# Patient Record
Sex: Female | Born: 1965
Health system: Southern US, Community
[De-identification: ages and names within clinical notes are randomized; demographics above are authoritative.]

## PROBLEM LIST (undated history)

## (undated) DIAGNOSIS — Z973 Presence of spectacles and contact lenses: Secondary | ICD-10-CM

## (undated) DIAGNOSIS — R51 Headache: Secondary | ICD-10-CM

## (undated) DIAGNOSIS — R87629 Unspecified abnormal cytological findings in specimens from vagina: Secondary | ICD-10-CM

## (undated) DIAGNOSIS — I959 Hypotension, unspecified: Secondary | ICD-10-CM

## (undated) DIAGNOSIS — T7840XA Allergy, unspecified, initial encounter: Secondary | ICD-10-CM

## (undated) DIAGNOSIS — R519 Headache, unspecified: Secondary | ICD-10-CM

## (undated) DIAGNOSIS — R569 Unspecified convulsions: Secondary | ICD-10-CM

## (undated) DIAGNOSIS — M8580 Other specified disorders of bone density and structure, unspecified site: Secondary | ICD-10-CM

## (undated) HISTORY — DX: Unspecified abnormal cytological findings in specimens from vagina: R87.629

## (undated) HISTORY — DX: Allergy, unspecified, initial encounter: T78.40XA

## (undated) HISTORY — PX: BREAST SURGERY: SHX581

## (undated) HISTORY — DX: Headache, unspecified: R51.9

## (undated) HISTORY — PX: CHOLECYSTECTOMY: SHX55

## (undated) HISTORY — DX: Headache: R51

## (undated) HISTORY — DX: Other specified disorders of bone density and structure, unspecified site: M85.80

## (undated) HISTORY — PX: ABDOMINAL HYSTERECTOMY: SHX81

## (undated) HISTORY — DX: Unspecified convulsions: R56.9

---

## 1987-02-16 HISTORY — PX: KNEE ARTHROSCOPY: SHX127

## 1992-10-18 DIAGNOSIS — R569 Unspecified convulsions: Secondary | ICD-10-CM

## 1992-10-18 HISTORY — DX: Unspecified convulsions: R56.9

## 2005-08-10 ENCOUNTER — Encounter (HOSPITAL_COMMUNITY): Admission: RE | Admit: 2005-08-10 | Discharge: 2005-09-09 | Payer: Self-pay | Admitting: Family Medicine

## 2005-08-18 HISTORY — PX: GALLBLADDER SURGERY: SHX652

## 2005-08-20 ENCOUNTER — Ambulatory Visit (HOSPITAL_COMMUNITY): Admission: RE | Admit: 2005-08-20 | Discharge: 2005-08-20 | Payer: Self-pay | Admitting: General Surgery

## 2005-09-06 ENCOUNTER — Encounter (INDEPENDENT_AMBULATORY_CARE_PROVIDER_SITE_OTHER): Payer: Self-pay | Admitting: General Surgery

## 2005-09-06 ENCOUNTER — Observation Stay (HOSPITAL_COMMUNITY): Admission: RE | Admit: 2005-09-06 | Discharge: 2005-09-07 | Payer: Self-pay | Admitting: General Surgery

## 2006-05-28 ENCOUNTER — Emergency Department (HOSPITAL_COMMUNITY): Admission: EM | Admit: 2006-05-28 | Discharge: 2006-05-28 | Payer: Self-pay | Admitting: Family Medicine

## 2007-04-19 ENCOUNTER — Other Ambulatory Visit: Admission: RE | Admit: 2007-04-19 | Discharge: 2007-04-19 | Payer: Self-pay | Admitting: Gynecology

## 2007-05-29 ENCOUNTER — Encounter: Admission: RE | Admit: 2007-05-29 | Discharge: 2007-05-29 | Payer: Self-pay | Admitting: Gynecology

## 2007-11-29 ENCOUNTER — Other Ambulatory Visit: Admission: RE | Admit: 2007-11-29 | Discharge: 2007-11-29 | Payer: Self-pay | Admitting: Gynecology

## 2008-01-21 ENCOUNTER — Emergency Department (HOSPITAL_COMMUNITY): Admission: EM | Admit: 2008-01-21 | Discharge: 2008-01-21 | Payer: Self-pay | Admitting: Emergency Medicine

## 2008-04-24 ENCOUNTER — Other Ambulatory Visit: Admission: RE | Admit: 2008-04-24 | Discharge: 2008-04-24 | Payer: Self-pay | Admitting: Gynecology

## 2008-11-20 ENCOUNTER — Encounter: Admission: RE | Admit: 2008-11-20 | Discharge: 2008-11-20 | Payer: Self-pay | Admitting: Gynecology

## 2008-12-20 ENCOUNTER — Ambulatory Visit: Payer: Self-pay | Admitting: Gynecology

## 2009-05-30 ENCOUNTER — Other Ambulatory Visit: Admission: RE | Admit: 2009-05-30 | Discharge: 2009-05-30 | Payer: Self-pay | Admitting: Gynecology

## 2009-05-30 ENCOUNTER — Encounter: Payer: Self-pay | Admitting: Gynecology

## 2009-05-30 ENCOUNTER — Ambulatory Visit: Payer: Self-pay | Admitting: Gynecology

## 2010-09-11 ENCOUNTER — Encounter: Admission: RE | Admit: 2010-09-11 | Discharge: 2010-09-11 | Payer: Self-pay | Admitting: Gynecology

## 2010-11-08 ENCOUNTER — Encounter: Payer: Self-pay | Admitting: Gynecology

## 2010-12-04 ENCOUNTER — Encounter: Payer: Self-pay | Admitting: Gynecology

## 2010-12-14 ENCOUNTER — Encounter (INDEPENDENT_AMBULATORY_CARE_PROVIDER_SITE_OTHER): Payer: BC Managed Care – PPO | Admitting: Gynecology

## 2010-12-14 ENCOUNTER — Other Ambulatory Visit (HOSPITAL_COMMUNITY)
Admission: RE | Admit: 2010-12-14 | Discharge: 2010-12-14 | Disposition: A | Discharge status: Home / Self Care | Payer: BC Managed Care – PPO | Source: Ambulatory Visit | Attending: Gynecology | Admitting: Gynecology

## 2010-12-14 ENCOUNTER — Other Ambulatory Visit: Payer: Self-pay | Admitting: Gynecology

## 2010-12-14 DIAGNOSIS — Z124 Encounter for screening for malignant neoplasm of cervix: Secondary | ICD-10-CM | POA: Insufficient documentation

## 2010-12-14 DIAGNOSIS — R823 Hemoglobinuria: Secondary | ICD-10-CM

## 2010-12-14 DIAGNOSIS — Z13220 Encounter for screening for lipoid disorders: Secondary | ICD-10-CM

## 2010-12-14 DIAGNOSIS — Z833 Family history of diabetes mellitus: Secondary | ICD-10-CM

## 2010-12-14 DIAGNOSIS — Z01419 Encounter for gynecological examination (general) (routine) without abnormal findings: Secondary | ICD-10-CM

## 2010-12-17 ENCOUNTER — Other Ambulatory Visit (INDEPENDENT_AMBULATORY_CARE_PROVIDER_SITE_OTHER): Payer: BC Managed Care – PPO

## 2010-12-17 DIAGNOSIS — N390 Urinary tract infection, site not specified: Secondary | ICD-10-CM

## 2011-01-13 ENCOUNTER — Other Ambulatory Visit: Payer: BC Managed Care – PPO

## 2011-03-05 NOTE — Op Note (Signed)
Jamie Hale, RALPHS NO.:  [id number]   MEDICAL RECORD NO.:  [id number]          PATIENT TYPE:  OBV   LOCATION:  A301                          FACILITY:  [facility]   PHYSICIAN:  Barbaraann Barthel, M.D. DATE OF BIRTH:  16-Sep-1966   DATE OF PROCEDURE:  09/06/2005  DATE OF DISCHARGE:                                 OPERATIVE REPORT   SURGEON:  Dr. Malvin Johns.   POSTOPERATIVE DIAGNOSIS:  Cholecystitis secondary to biliary dyskinesia   PROCEDURE:  Laparoscopic cholecystectomy.   SPECIMEN:  Gallbladder.   NOTE:  This is a 45 year old white female who had at least four severe  episodes of right upper quadrant postprandial pain after fatty meals. This  was not relieved with Gas-X or Tums or other medications. She had a CT scan  that did not chest stones and ejection fraction on hepatobiliary scan was  39%. She continued to have right upper quadrant pain with medical treatment  and bloating. Sonogram did not show the presence of stones. The liver  function studies were all within normal limits without elevated amylase. We  discussed the diet manipulation which she had tried unsuccessfully, and she  requested surgery. We discussed the procedure in detail with her, discussing  complications not limited to but including bleeding, infection, damage to  bile duct, perforation of organs and transitory diarrhea. We also discussed  that results of cholecystectomy for biliary dyskinesia were not as  satisfying as when there is the presence of cholelithiasis. We also  discussed the possibility that open surgery may be required. Informed  consent was obtained.   GROSS OPERATIVE FINDINGS:  The patient had a normal right upper quadrant. No  normality is noted. Small cystic duct which was not cannulated. No  abnormalities noted otherwise other than bloating of her supine position,  after the adequate administration of general anesthesia via endotracheal  intubation, her entire was  prepped with Betadine solution and draped in  usual manner. Prior to this, a Foley catheter was aseptically inserted with  the patient in Trendelenburg. A periumbilical incision was carried out over  the superior aspect of the umbilicus. The fascia was grasped with a sharp  towel clip and elevated, the Veress needle was inserted and confirmed  persistent with position with a saline drop test. After being inserted, we  placed an 11-mm cannula in the epigastrium and two 5-mm cannulas in the  right upper quadrant laterally under direct vision. The gallbladder was  grasped. Adhesions were taken down, lysed, and photographs were taken of  this. These were triply silver clipped and divided. The gallbladder was  uneventfully removed from the hepatic bed. I elected to leave a piece of  Surgicel in this area, and after irrigating andand checking for hemostasis,  the gallbladder was removed with the Endosac device. We then used 0 Polysorb  to  close the incisions in the epigastrium and the umbilicus area using 1/2%  Sensorcaine on all port incisions for postoperative comfort. Prior to  closure, all sponge, needle and instrument counts were found to be correct.  Estimated blood loss was minimal.  The patient received 1,200 cc of  crystalloid. There were no complications.      Barbaraann Barthel, M.D.  Electronically Signed     WB/MEDQ  D:  09/06/2005  T:  09/06/2005  Job:  [id number]   cc:   Corrie Mckusick, M.D.  Fax: [phone]

## 2011-09-03 ENCOUNTER — Encounter: Payer: Self-pay | Admitting: Gynecology

## 2011-09-03 ENCOUNTER — Ambulatory Visit (INDEPENDENT_AMBULATORY_CARE_PROVIDER_SITE_OTHER): Payer: BC Managed Care – PPO | Admitting: Gynecology

## 2011-09-03 VITALS — BP 120/70

## 2011-09-03 DIAGNOSIS — B373 Candidiasis of vulva and vagina: Secondary | ICD-10-CM

## 2011-09-03 DIAGNOSIS — R300 Dysuria: Secondary | ICD-10-CM

## 2011-09-03 DIAGNOSIS — N899 Noninflammatory disorder of vagina, unspecified: Secondary | ICD-10-CM

## 2011-09-03 DIAGNOSIS — B3731 Acute candidiasis of vulva and vagina: Secondary | ICD-10-CM

## 2011-09-03 DIAGNOSIS — N390 Urinary tract infection, site not specified: Secondary | ICD-10-CM

## 2011-09-03 DIAGNOSIS — N898 Other specified noninflammatory disorders of vagina: Secondary | ICD-10-CM

## 2011-09-03 DIAGNOSIS — R82998 Other abnormal findings in urine: Secondary | ICD-10-CM

## 2011-09-03 DIAGNOSIS — R569 Unspecified convulsions: Secondary | ICD-10-CM | POA: Insufficient documentation

## 2011-09-03 MED ORDER — FLUCONAZOLE 150 MG PO TABS: 150.0000 mg | ORAL_TABLET | Freq: Once | ORAL | Status: AC

## 2011-09-03 MED ORDER — SULFAMETHOXAZOLE-TRIMETHOPRIM 800-160 MG PO TABS: 1.0000 | ORAL_TABLET | Freq: Two times a day (BID) | ORAL | Status: AC

## 2011-09-03 NOTE — Progress Notes (Signed)
Addended by: Landis Martins R on: 09/03/2011 05:13 PM   Modules accepted: Orders

## 2011-09-03 NOTE — Progress Notes (Signed)
Patient presents with several days of lower abdominal bloating, vaginal burning and some urinary hesitancy. No fevers chills low back pain.  Exam Abdomen soft nontender without masses guarding rebound organomegaly Pelvic external BUS vagina with white discharge. Cervix normal. Uterus normal size midline mobile nontender. Adnexa without masses or tenderness  Assessment and plan: 1. Yeast vaginitis. Wet prep is positive for yeast. We'll treat with Diflucan 150x1 dose. 2. UTI. Urinalysis low grade positive with 2-4 WBC, occasional epithelial cells, 1+ bacteria, positive leukocyte esterase. Will cover with Septra DS one by mouth twice a day x3 days follow up if symptoms persist or recur

## 2011-12-27 ENCOUNTER — Other Ambulatory Visit: Payer: Self-pay | Admitting: Gynecology

## 2011-12-27 DIAGNOSIS — Z1231 Encounter for screening mammogram for malignant neoplasm of breast: Secondary | ICD-10-CM

## 2011-12-29 ENCOUNTER — Ambulatory Visit
Admission: RE | Admit: 2011-12-29 | Discharge: 2011-12-29 | Disposition: A | Discharge status: Home / Self Care | Payer: BC Managed Care – PPO | Source: Ambulatory Visit | Attending: Gynecology | Admitting: Gynecology

## 2011-12-29 DIAGNOSIS — Z1231 Encounter for screening mammogram for malignant neoplasm of breast: Secondary | ICD-10-CM

## 2011-12-30 ENCOUNTER — Other Ambulatory Visit: Payer: Self-pay | Admitting: Gynecology

## 2011-12-31 ENCOUNTER — Other Ambulatory Visit: Payer: Self-pay | Admitting: *Deleted

## 2011-12-31 DIAGNOSIS — N63 Unspecified lump in unspecified breast: Secondary | ICD-10-CM

## 2012-01-04 ENCOUNTER — Ambulatory Visit
Admission: RE | Admit: 2012-01-04 | Discharge: 2012-01-04 | Disposition: A | Discharge status: Home / Self Care | Payer: BC Managed Care – PPO | Source: Ambulatory Visit | Attending: Gynecology | Admitting: Gynecology

## 2012-01-04 DIAGNOSIS — N63 Unspecified lump in unspecified breast: Secondary | ICD-10-CM

## 2012-01-11 ENCOUNTER — Ambulatory Visit (INDEPENDENT_AMBULATORY_CARE_PROVIDER_SITE_OTHER): Payer: BC Managed Care – PPO | Admitting: Gynecology

## 2012-01-11 ENCOUNTER — Encounter: Payer: Self-pay | Admitting: Gynecology

## 2012-01-11 VITALS — BP 102/60 | Ht 68.0 in | Wt 135.0 lb

## 2012-01-11 DIAGNOSIS — Z01419 Encounter for gynecological examination (general) (routine) without abnormal findings: Secondary | ICD-10-CM

## 2012-01-11 DIAGNOSIS — Z13220 Encounter for screening for lipoid disorders: Secondary | ICD-10-CM

## 2012-01-11 DIAGNOSIS — Z131 Encounter for screening for diabetes mellitus: Secondary | ICD-10-CM

## 2012-01-11 NOTE — Progress Notes (Signed)
Jamie Hale May 27, 1966 [id number]        46 y.o.  for annual exam.  Doing well no complaints.  Past medical history,surgical history, medications, allergies, family history and social history were all reviewed and documented in the EPIC chart. ROS:  Was performed and pertinent positives and negatives are included in the history.  Exam: [name] chaperone present Filed Vitals:   01/11/12 1138  BP: 102/60   General appearance  Normal Skin grossly normal Head/Neck normal with no cervical or supraclavicular adenopathy thyroid normal Lungs  clear Cardiac RR, without RMG Abdominal  soft, nontender, without masses, organomegaly or hernia Breasts  examined lying and sitting without masses, retractions, discharge or axillary adenopathy. Pelvic  Ext/BUS/vagina  normal   Cervix  normal    Uterus  anteverted, normal size, shape and contour, midline and mobile nontender   Adnexa  Without masses or tenderness    Anus and perineum  normal   Rectovaginal  normal sphincter tone without palpated masses or tenderness.    Assessment/Plan:  46 y.o. female for annual exam.    1. Regular menses doing well. 2. Contraception. Patient using condoms. We again discussed the failure risk and the availability of Plan B. Alternatives reviewed and she's not interested and wants to continue using condoms. 3. Mammography. Patient just had her mammogram which was normal. Will continue with annual mammography. SBE monthly reviewed. 4. Pap smear. No Pap smear was done today. Last Pap smear 2012. She has no history of abnormalities with multiple normal records in her chart. I reviewed current screening guidelines we'll plan on every three-year Pap smears. 5. Health maintenance. Patient ate breakfast this morning. She'll return for fasting CBC lipid profile and glucose. Urinalysis today was done. Assuming she continues well from a gynecologic standpoint and she will see [place] in a year, sooner as  needed.     [name] MD, 11:56 AM 01/11/2012

## 2012-01-11 NOTE — Patient Instructions (Signed)
Follow up for fasting blood work as discussed. Follow up in one year for annual gynecologic exam.

## 2012-01-12 LAB — URINALYSIS W MICROSCOPIC + REFLEX CULTURE
Bacteria, UA: NONE SEEN
Bilirubin Urine: NEGATIVE
Casts: NONE SEEN
Crystals: NONE SEEN
Glucose, UA: NEGATIVE mg/dL
Hgb urine dipstick: NEGATIVE
Ketones, ur: NEGATIVE mg/dL
Nitrite: NEGATIVE
Protein, ur: NEGATIVE mg/dL
Specific Gravity, Urine: <1.005 — ABNORMAL LOW (ref 1.005–1.030)
Squamous Epithelial / HPF: NONE SEEN
Urobilinogen, UA: 0.2 mg/dL (ref 0.0–1.0)
pH: 6 (ref 5.0–8.0)

## 2012-01-13 LAB — URINE CULTURE: Colony Count: 1000

## 2012-12-26 ENCOUNTER — Other Ambulatory Visit: Payer: Self-pay

## 2012-12-26 DIAGNOSIS — Z1231 Encounter for screening mammogram for malignant neoplasm of breast: Secondary | ICD-10-CM

## 2013-01-18 ENCOUNTER — Ambulatory Visit
Admission: RE | Admit: 2013-01-18 | Discharge: 2013-01-18 | Disposition: A | Discharge status: Home / Self Care | Payer: BC Managed Care – PPO | Source: Ambulatory Visit

## 2013-01-18 DIAGNOSIS — Z1231 Encounter for screening mammogram for malignant neoplasm of breast: Secondary | ICD-10-CM

## 2013-01-22 ENCOUNTER — Other Ambulatory Visit: Payer: Self-pay | Admitting: Gynecology

## 2013-01-23 ENCOUNTER — Other Ambulatory Visit: Payer: Self-pay | Admitting: *Deleted

## 2013-01-23 DIAGNOSIS — N6320 Unspecified lump in the left breast, unspecified quadrant: Secondary | ICD-10-CM

## 2013-01-23 DIAGNOSIS — R928 Other abnormal and inconclusive findings on diagnostic imaging of breast: Secondary | ICD-10-CM

## 2013-02-01 ENCOUNTER — Ambulatory Visit
Admission: RE | Admit: 2013-02-01 | Discharge: 2013-02-01 | Disposition: A | Discharge status: Home / Self Care | Payer: BC Managed Care – PPO | Source: Ambulatory Visit | Attending: Gynecology | Admitting: Gynecology

## 2013-02-01 DIAGNOSIS — R928 Other abnormal and inconclusive findings on diagnostic imaging of breast: Secondary | ICD-10-CM

## 2013-02-01 DIAGNOSIS — N6320 Unspecified lump in the left breast, unspecified quadrant: Secondary | ICD-10-CM

## 2013-03-08 ENCOUNTER — Encounter: Payer: Self-pay | Admitting: Gynecology

## 2013-03-08 ENCOUNTER — Ambulatory Visit (INDEPENDENT_AMBULATORY_CARE_PROVIDER_SITE_OTHER): Payer: BC Managed Care – PPO | Admitting: Gynecology

## 2013-03-08 VITALS — BP 116/70

## 2013-03-08 DIAGNOSIS — N390 Urinary tract infection, site not specified: Secondary | ICD-10-CM

## 2013-03-08 DIAGNOSIS — R300 Dysuria: Secondary | ICD-10-CM

## 2013-03-08 LAB — URINALYSIS W MICROSCOPIC + REFLEX CULTURE
Bilirubin Urine: NEGATIVE
Casts: NONE SEEN
Crystals: NONE SEEN
Glucose, UA: NEGATIVE mg/dL
Ketones, ur: NEGATIVE mg/dL
Nitrite: NEGATIVE
Protein, ur: 30 mg/dL — AB
Specific Gravity, Urine: 1.025 (ref 1.005–1.030)
Urobilinogen, UA: 0.2 mg/dL (ref 0.0–1.0)
pH: 5 (ref 5.0–8.0)

## 2013-03-08 MED ORDER — CIPROFLOXACIN HCL 250 MG PO TABS: 250.0000 mg | ORAL_TABLET | Freq: Two times a day (BID) | ORAL | Status: DC

## 2013-03-08 MED ORDER — FLUCONAZOLE 100 MG PO TABS: ORAL_TABLET | ORAL | Status: DC

## 2013-03-08 NOTE — Progress Notes (Signed)
Patient presented to the office today stating that yesterday she began complaining of urinary frequency but did not appear to be emptying completely. She was also experiencing dysuria but no fever chills nausea or vomiting or any back pain. Patient denied any vaginal discharge. She's currently using condoms for contraception and her cycles of been regular.  Exam: Back: No CVA tenderness Abdomen: Soft slightly tender suprapubic area Pelvic: Bartholin urethra Skene was within normal limits Vagina: No lesions or discharge Cervix: No lesions or discharge Uterus: Anteverted tender over the bladder area Adnexa: No palpable masses or tenderness Rectal exam: Not done  Urinalysis: WBC 11-20,  RBC 11-20   Bacteria many  Assessment/plan: Clinical evidence of urinary tract infection. Patient will be placed on Cipro 250 mg twice a day for 3 days. She was given sample of Uribell (antispasmodic agent) to take 1 by mouth 4 times a day for the next 2 days. Patient was instructed to increase her fluid intake. The patient did develop any severe back pain or fever she will report to the office immediately or after hours to the emergency room.

## 2013-03-08 NOTE — Patient Instructions (Addendum)
Urinary Tract Infection  Urinary tract infections (UTIs) can develop anywhere along your urinary tract. Your urinary tract is your body's drainage system for removing wastes and extra water. Your urinary tract includes two kidneys, two ureters, a bladder, and a urethra. Your kidneys are a pair of bean-shaped organs. Each kidney is about the size of your fist. They are located below your ribs, one on each side of your spine.  CAUSES  Infections are caused by microbes, which are microscopic organisms, including fungi, viruses, and bacteria. These organisms are so small that they can only be seen through a microscope. Bacteria are the microbes that most commonly cause UTIs.  SYMPTOMS   Symptoms of UTIs may vary by age and gender of the patient and by the location of the infection. Symptoms in young women typically include a frequent and intense urge to urinate and a painful, burning feeling in the bladder or urethra during urination. Older women and men are more likely to be tired, shaky, and weak and have muscle aches and abdominal pain. A fever may mean the infection is in your kidneys. Other symptoms of a kidney infection include pain in your back or sides below the ribs, nausea, and vomiting.  DIAGNOSIS  To diagnose a UTI, your caregiver will ask you about your symptoms. Your caregiver also will ask to provide a urine sample. The urine sample will be tested for bacteria and white blood cells. White blood cells are made by your body to help fight infection.  TREATMENT   Typically, UTIs can be treated with medication. Because most UTIs are caused by a bacterial infection, they usually can be treated with the use of antibiotics. The choice of antibiotic and length of treatment depend on your symptoms and the type of bacteria causing your infection.  HOME CARE INSTRUCTIONS   If you were prescribed antibiotics, take them exactly as your caregiver instructs you. Finish the medication even if you feel better after you  have only taken some of the medication.   Drink enough water and fluids to keep your urine clear or pale yellow.   Avoid caffeine, tea, and carbonated beverages. They tend to irritate your bladder.   Empty your bladder often. Avoid holding urine for long periods of time.   Empty your bladder before and after sexual intercourse.   After a bowel movement, women should cleanse from front to back. Use each tissue only once.  SEEK MEDICAL CARE IF:    You have back pain.   You develop a fever.   Your symptoms do not begin to resolve within 3 days.  SEEK IMMEDIATE MEDICAL CARE IF:    You have severe back pain or lower abdominal pain.   You develop chills.   You have nausea or vomiting.   You have continued burning or discomfort with urination.  MAKE SURE YOU:    Understand these instructions.   Will watch your condition.   Will get help right away if you are not doing well or get worse.  Document Released: 07/14/2005 Document Revised: 04/04/2012 Document Reviewed: 11/12/2011  [employer] Patient Information 2014 [employer].

## 2013-03-11 LAB — URINE CULTURE: Colony Count: >=100000

## 2013-03-29 ENCOUNTER — Ambulatory Visit (INDEPENDENT_AMBULATORY_CARE_PROVIDER_SITE_OTHER): Payer: BC Managed Care – PPO | Admitting: Women's Health

## 2013-03-29 ENCOUNTER — Encounter: Payer: Self-pay | Admitting: Women's Health

## 2013-03-29 ENCOUNTER — Other Ambulatory Visit: Payer: Self-pay | Admitting: Women's Health

## 2013-03-29 DIAGNOSIS — R300 Dysuria: Secondary | ICD-10-CM

## 2013-03-29 DIAGNOSIS — N899 Noninflammatory disorder of vagina, unspecified: Secondary | ICD-10-CM

## 2013-03-29 DIAGNOSIS — N390 Urinary tract infection, site not specified: Secondary | ICD-10-CM

## 2013-03-29 DIAGNOSIS — N898 Other specified noninflammatory disorders of vagina: Secondary | ICD-10-CM

## 2013-03-29 LAB — URINALYSIS W MICROSCOPIC + REFLEX CULTURE
Bilirubin Urine: NEGATIVE
Crystals: NONE SEEN
Glucose, UA: NEGATIVE mg/dL
Specific Gravity, Urine: 1.015 (ref 1.005–1.030)
pH: 7.5 (ref 5.0–8.0)

## 2013-03-29 LAB — WET PREP FOR TRICH, YEAST, CLUE

## 2013-03-29 MED ORDER — CIPROFLOXACIN HCL 500 MG PO TABS: 500.0000 mg | ORAL_TABLET | Freq: Two times a day (BID) | ORAL | Status: DC

## 2013-03-29 MED ORDER — FLUCONAZOLE 150 MG PO TABS: 150.0000 mg | ORAL_TABLET | Freq: Once | ORAL | Status: DC

## 2013-03-29 MED ORDER — NITROFURANTOIN MACROCRYSTAL 50 MG PO CAPS: ORAL_CAPSULE | ORAL | Status: DC

## 2013-03-29 NOTE — Patient Instructions (Addendum)
Urinary Tract Infection  Urinary tract infections (UTIs) can develop anywhere along your urinary tract. Your urinary tract is your body's drainage system for removing wastes and extra water. Your urinary tract includes two kidneys, two ureters, a bladder, and a urethra. Your kidneys are a pair of bean-shaped organs. Each kidney is about the size of your fist. They are located below your ribs, one on each side of your spine.  CAUSES  Infections are caused by microbes, which are microscopic organisms, including fungi, viruses, and bacteria. These organisms are so small that they can only be seen through a microscope. Bacteria are the microbes that most commonly cause UTIs.  SYMPTOMS   Symptoms of UTIs may vary by age and gender of the patient and by the location of the infection. Symptoms in young women typically include a frequent and intense urge to urinate and a painful, burning feeling in the bladder or urethra during urination. Older women and men are more likely to be tired, shaky, and weak and have muscle aches and abdominal pain. A fever may mean the infection is in your kidneys. Other symptoms of a kidney infection include pain in your back or sides below the ribs, nausea, and vomiting.  DIAGNOSIS  To diagnose a UTI, your caregiver will ask you about your symptoms. Your caregiver also will ask to provide a urine sample. The urine sample will be tested for bacteria and white blood cells. White blood cells are made by your body to help fight infection.  TREATMENT   Typically, UTIs can be treated with medication. Because most UTIs are caused by a bacterial infection, they usually can be treated with the use of antibiotics. The choice of antibiotic and length of treatment depend on your symptoms and the type of bacteria causing your infection.  HOME CARE INSTRUCTIONS   If you were prescribed antibiotics, take them exactly as your caregiver instructs you. Finish the medication even if you feel better after you  have only taken some of the medication.   Drink enough water and fluids to keep your urine clear or pale yellow.   Avoid caffeine, tea, and carbonated beverages. They tend to irritate your bladder.   Empty your bladder often. Avoid holding urine for long periods of time.   Empty your bladder before and after sexual intercourse.   After a bowel movement, women should cleanse from front to back. Use each tissue only once.  SEEK MEDICAL CARE IF:    You have back pain.   You develop a fever.   Your symptoms do not begin to resolve within 3 days.  SEEK IMMEDIATE MEDICAL CARE IF:    You have severe back pain or lower abdominal pain.   You develop chills.   You have nausea or vomiting.   You have continued burning or discomfort with urination.  MAKE SURE YOU:    Understand these instructions.   Will watch your condition.   Will get help right away if you are not doing well or get worse.  Document Released: 07/14/2005 Document Revised: 04/04/2012 Document Reviewed: 11/12/2011  [employer] Patient Information 2014 [employer].

## 2013-03-29 NOTE — Progress Notes (Signed)
Patient ID: [name], female   DOB: October 26, 1965, 47 y.o.   MRN: [id number] Presents with complaint of increased urinary frequency, urgency, burning with urination for 2 days. Was treated for UTI 03/08/13 with Cipro 250 BID for 3 days with good relief of symptoms. Antibiotic was culture proven. Reports UTI intercourse related, usually only gets 1 per year. Reports some vaginal discomfort, no discharge with odor. Denies abdominal pain or fever. Monthly cycles/condoms.   Exam: appears well. No CVAT, abdomen soft, external genitalia slightly erythematous at introitus, speculum exam scant white discharge, wet prep negative, bimanual no CMT or adnexal fullness or tenderness.  UA: Moderate leukocytes, 21-50 WBCs, many bacteria.  UTI  Plan: Cipro 500 twice daily for 5 days, UTI prevention discussed, Macrodantin 50 with intercourse. Instructed to call if no relief of symptoms. Diflucan 150 times one dose if vaginal itching.

## 2013-04-01 LAB — URINE CULTURE: Colony Count: >=100000

## 2013-04-12 ENCOUNTER — Ambulatory Visit (INDEPENDENT_AMBULATORY_CARE_PROVIDER_SITE_OTHER): Payer: BC Managed Care – PPO | Admitting: Gynecology

## 2013-04-12 ENCOUNTER — Encounter: Payer: Self-pay | Admitting: Gynecology

## 2013-04-12 VITALS — BP 110/70 | Ht 68.0 in | Wt 128.0 lb

## 2013-04-12 DIAGNOSIS — Z01419 Encounter for gynecological examination (general) (routine) without abnormal findings: Secondary | ICD-10-CM

## 2013-04-12 NOTE — Progress Notes (Signed)
Jamie Hale [birth year] [id number]        47 y.o.  G2P2002 for annual exam.  Doing well without complaints.  Past medical history,surgical history, medications, allergies, family history and social history were all reviewed and documented in the EPIC chart.  ROS:  Performed and pertinent positives and negatives are included in the history, assessment and plan .  Exam:  Kim assistant Filed Vitals:   04/12/13 1208  BP: 110/70  Height: 5\' 8"  (1.727 m)  Weight: 128 lb (58.06 kg)   General appearance  Normal Skin grossly normal Head/Neck normal with no cervical or supraclavicular adenopathy thyroid normal Lungs  clear Cardiac RR, without RMG Abdominal  soft, nontender, without masses, organomegaly or hernia Breasts  examined lying and sitting without masses, retractions, discharge or axillary adenopathy. Pelvic  Ext/BUS/vagina  normal   Cervix  normal   Uterus  anteverted, normal size, shape and contour, midline and mobile nontender   Adnexa  Without masses or tenderness    Anus and perineum  normal   Rectovaginal  normal sphincter tone without palpated masses or tenderness.    Assessment/Plan:  47 y.o. G52P2002 female for annual exam , regular menses, condom birth control.   1. Contraception. Patient using condoms. Reviewed increased failure risk. Patient understands and accepts. Availability of plan see again reviewed. 2. Mammography 01/2013. Continue with annual mammography. SBE monthly reviewed. 3. Pap smear 2012. No Pap smear done today. No history of abnormal Pap smears previously. Plan repeat Pap smear in one year at three-year intervals. 4. Health maintenance. Patient had all of her blood work done through work and reports normal. Followup one year, sooner as needed.    [name] MD, 12:41 PM 04/12/2013

## 2013-04-12 NOTE — Patient Instructions (Signed)
Followup in one year for annual exam, sooner as needed. 

## 2013-04-13 LAB — URINALYSIS W MICROSCOPIC + REFLEX CULTURE
Bacteria, UA: NONE SEEN
Bilirubin Urine: NEGATIVE
Crystals: NONE SEEN
Ketones, ur: NEGATIVE mg/dL
Nitrite: NEGATIVE
Protein, ur: NEGATIVE mg/dL
Specific Gravity, Urine: 1.008 (ref 1.005–1.030)
Squamous Epithelial / LPF: NONE SEEN
Urobilinogen, UA: 0.2 mg/dL (ref 0.0–1.0)

## 2013-11-26 ENCOUNTER — Other Ambulatory Visit: Payer: Self-pay

## 2013-11-26 DIAGNOSIS — Z1231 Encounter for screening mammogram for malignant neoplasm of breast: Secondary | ICD-10-CM

## 2014-01-21 ENCOUNTER — Other Ambulatory Visit: Payer: Self-pay

## 2014-01-21 ENCOUNTER — Ambulatory Visit
Admission: RE | Admit: 2014-01-21 | Discharge: 2014-01-21 | Disposition: A | Discharge status: Home / Self Care | Payer: BC Managed Care – PPO | Source: Ambulatory Visit

## 2014-01-21 ENCOUNTER — Other Ambulatory Visit: Payer: Self-pay | Admitting: Gynecology

## 2014-01-21 DIAGNOSIS — Z1231 Encounter for screening mammogram for malignant neoplasm of breast: Secondary | ICD-10-CM

## 2014-01-21 DIAGNOSIS — N63 Unspecified lump in unspecified breast: Secondary | ICD-10-CM

## 2014-01-23 ENCOUNTER — Other Ambulatory Visit: Payer: Self-pay | Admitting: Gynecology

## 2014-01-23 ENCOUNTER — Ambulatory Visit
Admission: RE | Admit: 2014-01-23 | Discharge: 2014-01-23 | Disposition: A | Discharge status: Home / Self Care | Payer: BC Managed Care – PPO | Source: Ambulatory Visit | Attending: Gynecology | Admitting: Gynecology

## 2014-01-23 DIAGNOSIS — N63 Unspecified lump in unspecified breast: Secondary | ICD-10-CM

## 2014-01-29 ENCOUNTER — Other Ambulatory Visit: Payer: BC Managed Care – PPO

## 2014-01-30 ENCOUNTER — Ambulatory Visit
Admission: RE | Admit: 2014-01-30 | Discharge: 2014-01-30 | Disposition: A | Discharge status: Home / Self Care | Payer: BC Managed Care – PPO | Source: Ambulatory Visit | Attending: Gynecology | Admitting: Gynecology

## 2014-01-30 ENCOUNTER — Inpatient Hospital Stay: Admission: RE | Admit: 2014-01-30 | Payer: BC Managed Care – PPO | Source: Ambulatory Visit

## 2014-01-30 DIAGNOSIS — N63 Unspecified lump in unspecified breast: Secondary | ICD-10-CM

## 2014-01-30 HISTORY — PX: BREAST BIOPSY: SHX20

## 2014-07-02 ENCOUNTER — Ambulatory Visit (INDEPENDENT_AMBULATORY_CARE_PROVIDER_SITE_OTHER): Payer: BC Managed Care – PPO | Admitting: Gynecology

## 2014-07-02 ENCOUNTER — Encounter: Payer: Self-pay | Admitting: Gynecology

## 2014-07-02 DIAGNOSIS — N3000 Acute cystitis without hematuria: Secondary | ICD-10-CM

## 2014-07-02 DIAGNOSIS — N899 Noninflammatory disorder of vagina, unspecified: Secondary | ICD-10-CM

## 2014-07-02 DIAGNOSIS — R300 Dysuria: Secondary | ICD-10-CM

## 2014-07-02 DIAGNOSIS — N898 Other specified noninflammatory disorders of vagina: Secondary | ICD-10-CM

## 2014-07-02 LAB — URINALYSIS W MICROSCOPIC + REFLEX CULTURE
Bilirubin Urine: NEGATIVE
CRYSTALS: NONE SEEN
Casts: NONE SEEN
Glucose, UA: NEGATIVE mg/dL
Ketones, ur: NEGATIVE mg/dL
NITRITE: NEGATIVE
PROTEIN: NEGATIVE mg/dL
UROBILINOGEN UA: 0.2 mg/dL (ref 0.0–1.0)
pH: 7 (ref 5.0–8.0)

## 2014-07-02 LAB — WET PREP FOR TRICH, YEAST, CLUE
CLUE CELLS WET PREP: NONE SEEN
Trich, Wet Prep: NONE SEEN
Yeast Wet Prep HPF POC: NONE SEEN

## 2014-07-02 MED ORDER — SULFAMETHOXAZOLE-TRIMETHOPRIM 800-160 MG PO TABS: 1.0000 | ORAL_TABLET | Freq: Two times a day (BID) | ORAL | Status: DC

## 2014-07-02 NOTE — Progress Notes (Signed)
AKIBA MELFI Jul 04, 1966 [id number]        48 y.o.  [id number] Presents with one-day history of increasing frequency, dysuria and mild low back pain. No fever chills nausea vomiting. Also notice a little vaginal irritation but no discharge or odor.  Past medical history,surgical history, problem list, medications, allergies, family history and social history were all reviewed and documented in the EPIC chart.  Directed ROS with pertinent positives and negatives documented in the history of present illness/assessment and plan.  Exam: Kim assistant General appearance:  Normal Spine straight without CVA tenderness Abdomen soft nontender without masses guarding rebound organomegaly Pelvic external BUS vagina normal. Cervix normal. Uterus normal size midline mobile nontender. Adnexa without masses or tenderness.  Assessment/Plan:  48 y.o. [id number] with acute UTI 1 day duration. Mild vaginal irritation but wet prep and exam is normal. Treat with Septra DS 1 by mouth twice a day x3 days. Follow up if symptoms persist, worsen or recur.   Note: This document was prepared with digital dictation and possible smart phrase technology. Any transcriptional errors that result from this process are unintentional.   [name] MD, 11:52 AM 07/02/2014

## 2014-07-02 NOTE — Patient Instructions (Signed)
Take Septra antibiotic twice daily for 3 days. Call if her symptoms persist, worsen or recur. Follow up at your scheduled annual exam appointment.

## 2014-07-04 ENCOUNTER — Ambulatory Visit (INDEPENDENT_AMBULATORY_CARE_PROVIDER_SITE_OTHER): Payer: BC Managed Care – PPO | Admitting: Gynecology

## 2014-07-04 ENCOUNTER — Encounter: Payer: Self-pay | Admitting: Gynecology

## 2014-07-04 DIAGNOSIS — R300 Dysuria: Secondary | ICD-10-CM

## 2014-07-04 LAB — URINALYSIS W MICROSCOPIC + REFLEX CULTURE
Bilirubin Urine: NEGATIVE
Glucose, UA: NEGATIVE mg/dL
Hgb urine dipstick: NEGATIVE
KETONES UR: NEGATIVE mg/dL
Leukocytes, UA: NEGATIVE
Nitrite: NEGATIVE
PROTEIN: NEGATIVE mg/dL
Specific Gravity, Urine: 1.01 (ref 1.005–1.030)
Urobilinogen, UA: 0.2 mg/dL (ref 0.0–1.0)
pH: 7.5 (ref 5.0–8.0)

## 2014-07-04 MED ORDER — CIPROFLOXACIN HCL 500 MG PO TABS: 500.0000 mg | ORAL_TABLET | Freq: Two times a day (BID) | ORAL | Status: DC

## 2014-07-04 NOTE — Progress Notes (Signed)
Jamie Hale [birth year] [id number]        48 y.o.  [id number] Presents having been seen several days ago with acute UTI and started on Septra DS. She's on her last day and notes that her hematuria that she developed is gone as well as low-grade fevers but she still is having a lot of bladder pressure and frequency. She's afraid that she has not cleared her UTI. The culture did show Escherichia coli greater than 100,000 but antibiotic sensitivities have not returned yet.  Past medical history,surgical history, problem list, medications, allergies, family history and social history were all reviewed and documented in the EPIC chart.  Directed ROS with pertinent positives and negatives documented in the history of present illness/assessment and plan.  Exam: Kim assistant General appearance:  Normal Spine straight without CVA tenderness Abdomen soft nontender without masses guarding rebound organomegaly Pelvic external BUS vagina normal. Bimanual without masses or tenderness  Assessment/Plan:  48 y.o. [id number] with symptoms persisting of UTI. Urinalysis is negative today. Will cover with ciprofloxacin 500 mg twice a day x7 days. Follow up on antibiotic sensitivities. Patient will follow up if symptoms persist, worsen or recur.     [name] MD, 12:07 PM 07/04/2014

## 2014-07-04 NOTE — Patient Instructions (Signed)
Take the ciprofloxacin antibiotic twice daily for 7 days. Follow up if your symptoms persist, worsen or recur.   Urinary Tract Infection Urinary tract infections (UTIs) can develop anywhere along your urinary tract. Your urinary tract is your body's drainage system for removing wastes and extra water. Your urinary tract includes two kidneys, two ureters, a bladder, and a urethra. Your kidneys are a pair of bean-shaped organs. Each kidney is about the size of your fist. They are located below your ribs, one on each side of your spine. CAUSES Infections are caused by microbes, which are microscopic organisms, including fungi, viruses, and bacteria. These organisms are so small that they can only be seen through a microscope. Bacteria are the microbes that most commonly cause UTIs. SYMPTOMS  Symptoms of UTIs may vary by age and gender of the patient and by the location of the infection. Symptoms in young women typically include a frequent and intense urge to urinate and a painful, burning feeling in the bladder or urethra during urination. Older women and men are more likely to be tired, shaky, and weak and have muscle aches and abdominal pain. A fever may mean the infection is in your kidneys. Other symptoms of a kidney infection include pain in your back or sides below the ribs, nausea, and vomiting. DIAGNOSIS To diagnose a UTI, your caregiver will ask you about your symptoms. Your caregiver also will ask to provide a urine sample. The urine sample will be tested for bacteria and white blood cells. White blood cells are made by your body to help fight infection. TREATMENT  Typically, UTIs can be treated with medication. Because most UTIs are caused by a bacterial infection, they usually can be treated with the use of antibiotics. The choice of antibiotic and length of treatment depend on your symptoms and the type of bacteria causing your infection. HOME CARE INSTRUCTIONS  If you were prescribed  antibiotics, take them exactly as your caregiver instructs you. Finish the medication even if you feel better after you have only taken some of the medication.  Drink enough water and fluids to keep your urine clear or pale yellow.  Avoid caffeine, tea, and carbonated beverages. They tend to irritate your bladder.  Empty your bladder often. Avoid holding urine for long periods of time.  Empty your bladder before and after sexual intercourse.  After a bowel movement, women should cleanse from front to back. Use each tissue only once. SEEK MEDICAL CARE IF:   You have back pain.  You develop a fever.  Your symptoms do not begin to resolve within 3 days. SEEK IMMEDIATE MEDICAL CARE IF:   You have severe back pain or lower abdominal pain.  You develop chills.  You have nausea or vomiting.  You have continued burning or discomfort with urination. MAKE SURE YOU:   Understand these instructions.  Will watch your condition.  Will get help right away if you are not doing well or get worse. Document Released: 07/14/2005 Document Revised: 04/04/2012 Document Reviewed: 11/12/2011 [facility] Patient Information 2015 [place], [place]. This information is not intended to replace advice given to you by your health care provider. Make sure you discuss any questions you have with your health care provider.

## 2014-07-05 LAB — URINE CULTURE

## 2014-07-25 ENCOUNTER — Ambulatory Visit (INDEPENDENT_AMBULATORY_CARE_PROVIDER_SITE_OTHER): Payer: BC Managed Care – PPO | Admitting: Gynecology

## 2014-07-25 ENCOUNTER — Other Ambulatory Visit (HOSPITAL_COMMUNITY)
Admission: RE | Admit: 2014-07-25 | Discharge: 2014-07-25 | Disposition: A | Discharge status: Home / Self Care | Payer: BC Managed Care – PPO | Source: Ambulatory Visit | Attending: Gynecology | Admitting: Gynecology

## 2014-07-25 ENCOUNTER — Encounter: Payer: Self-pay | Admitting: Gynecology

## 2014-07-25 VITALS — BP 110/66 | Ht 68.0 in | Wt 136.0 lb

## 2014-07-25 DIAGNOSIS — Z1151 Encounter for screening for human papillomavirus (HPV): Secondary | ICD-10-CM | POA: Diagnosis present

## 2014-07-25 DIAGNOSIS — Z124 Encounter for screening for malignant neoplasm of cervix: Secondary | ICD-10-CM

## 2014-07-25 DIAGNOSIS — Z01419 Encounter for gynecological examination (general) (routine) without abnormal findings: Secondary | ICD-10-CM | POA: Insufficient documentation

## 2014-07-25 NOTE — Patient Instructions (Signed)
You may obtain a copy of any labs that were done today by logging onto MyChart as outlined in the instructions provided with your AVS (after visit summary). The office will not call with normal lab results but certainly if there are any significant abnormalities then we will contact you.   Health Maintenance, Female A healthy lifestyle and preventative care can promote health and wellness.  Maintain regular health, dental, and eye exams.  Eat a healthy diet. Foods like vegetables, fruits, whole grains, low-fat dairy products, and lean protein foods contain the nutrients you need without too many calories. Decrease your intake of foods high in solid fats, added sugars, and salt. Get information about a proper diet from your caregiver, if necessary.  Regular physical exercise is one of the most important things you can do for your health. Most adults should get at least 150 minutes of moderate-intensity exercise (any activity that increases your heart rate and causes you to sweat) each week. In addition, most adults need muscle-strengthening exercises on 2 or more days a week.   Maintain a healthy weight. The body mass index (BMI) is a screening tool to identify possible weight problems. It provides an estimate of body fat based on height and weight. Your caregiver can help determine your BMI, and can help you achieve or maintain a healthy weight. For adults 20 years and older:  A BMI below 18.5 is considered underweight.  A BMI of 18.5 to 24.9 is normal.  A BMI of 25 to 29.9 is considered overweight.  A BMI of 30 and above is considered obese.  Maintain normal blood lipids and cholesterol by exercising and minimizing your intake of saturated fat. Eat a balanced diet with plenty of fruits and vegetables. Blood tests for lipids and cholesterol should begin at age 61 and be repeated every 5 years. If your lipid or cholesterol levels are high, you are over 50, or you are a high risk for heart  disease, you may need your cholesterol levels checked more frequently.Ongoing high lipid and cholesterol levels should be treated with medicines if diet and exercise are not effective.  If you smoke, find out from your caregiver how to quit. If you do not use tobacco, do not start.  Lung cancer screening is recommended for adults aged 33 80 years who are at high risk for developing lung cancer because of a history of smoking. Yearly low-dose computed tomography (CT) is recommended for people who have at least a 30-pack-year history of smoking and are a current smoker or have quit within the past 15 years. A pack year of smoking is smoking an average of 1 pack of cigarettes a day for 1 year (for example: 1 pack a day for 30 years or 2 packs a day for 15 years). Yearly screening should continue until the smoker has stopped smoking for at least 15 years. Yearly screening should also be stopped for people who develop a health problem that would prevent them from having lung cancer treatment.  If you are pregnant, do not drink alcohol. If you are breastfeeding, be very cautious about drinking alcohol. If you are not pregnant and choose to drink alcohol, do not exceed 1 drink per day. One drink is considered to be 12 ounces (355 mL) of beer, 5 ounces (148 mL) of wine, or 1.5 ounces (44 mL) of liquor.  Avoid use of street drugs. Do not share needles with anyone. Ask for help if you need support or instructions about stopping  the use of drugs.  High blood pressure causes heart disease and increases the risk of stroke. Blood pressure should be checked at least every 1 to 2 years. Ongoing high blood pressure should be treated with medicines, if weight loss and exercise are not effective.  If you are 59 to 46 years old, ask your caregiver if you should take aspirin to prevent strokes.  Diabetes screening involves taking a blood sample to check your fasting blood sugar level. This should be done once every 3  years, after age 91, if you are within normal weight and without risk factors for diabetes. Testing should be considered at a younger age or be carried out more frequently if you are overweight and have at least 1 risk factor for diabetes.  Breast cancer screening is essential preventative care for women. You should practice "breast self-awareness." This means understanding the normal appearance and feel of your breasts and may include breast self-examination. Any changes detected, no matter how small, should be reported to a caregiver. Women in their 66s and 30s should have a clinical breast exam (CBE) by a caregiver as part of a regular health exam every 1 to 3 years. After age 101, women should have a CBE every year. Starting at age 100, women should consider having a mammogram (breast X-ray) every year. Women who have a family history of breast cancer should talk to their caregiver about genetic screening. Women at a high risk of breast cancer should talk to their caregiver about having an MRI and a mammogram every year.  Breast cancer gene (BRCA)-related cancer risk assessment is recommended for women who have family members with BRCA-related cancers. BRCA-related cancers include breast, ovarian, tubal, and peritoneal cancers. Having family members with these cancers may be associated with an increased risk for harmful changes (mutations) in the breast cancer genes BRCA1 and BRCA2. Results of the assessment will determine the need for genetic counseling and BRCA1 and BRCA2 testing.  The Pap test is a screening test for cervical cancer. Women should have a Pap test starting at age 57. Between ages 25 and 35, Pap tests should be repeated every 2 years. Beginning at age 37, you should have a Pap test every 3 years as long as the past 3 Pap tests have been normal. If you had a hysterectomy for a problem that was not cancer or a condition that could lead to cancer, then you no longer need Pap tests. If you are  between ages 50 and 76, and you have had normal Pap tests going back 10 years, you no longer need Pap tests. If you have had past treatment for cervical cancer or a condition that could lead to cancer, you need Pap tests and screening for cancer for at least 20 years after your treatment. If Pap tests have been discontinued, risk factors (such as a new sexual partner) need to be reassessed to determine if screening should be resumed. Some women have medical problems that increase the chance of getting cervical cancer. In these cases, your caregiver may recommend more frequent screening and Pap tests.  The human papillomavirus (HPV) test is an additional test that may be used for cervical cancer screening. The HPV test looks for the virus that can cause the cell changes on the cervix. The cells collected during the Pap test can be tested for HPV. The HPV test could be used to screen women aged 44 years and older, and should be used in women of any age  who have unclear Pap test results. After the age of 55, women should have HPV testing at the same frequency as a Pap test.  Colorectal cancer can be detected and often prevented. Most routine colorectal cancer screening begins at the age of 44 and continues through age 20. However, your caregiver may recommend screening at an earlier age if you have risk factors for colon cancer. On a yearly basis, your caregiver may provide home test kits to check for hidden blood in the stool. Use of a small camera at the end of a tube, to directly examine the colon (sigmoidoscopy or colonoscopy), can detect the earliest forms of colorectal cancer. Talk to your caregiver about this at age 86, when routine screening begins. Direct examination of the colon should be repeated every 5 to 10 years through age 13, unless early forms of pre-cancerous polyps or small growths are found.  Hepatitis C blood testing is recommended for all people born from [birth year] through 1963 and any  individual with known risks for hepatitis C.  Practice safe sex. Use condoms and avoid high-risk sexual practices to reduce the spread of sexually transmitted infections (STIs). Sexually active women aged 36 and younger should be checked for Chlamydia, which is a common sexually transmitted infection. Older women with new or multiple partners should also be tested for Chlamydia. Testing for other STIs is recommended if you are sexually active and at increased risk.  Osteoporosis is a disease in which the bones lose minerals and strength with aging. This can result in serious bone fractures. The risk of osteoporosis can be identified using a bone density scan. Women ages 20 and over and women at risk for fractures or osteoporosis should discuss screening with their caregivers. Ask your caregiver whether you should be taking a calcium supplement or vitamin D to reduce the rate of osteoporosis.  Menopause can be associated with physical symptoms and risks. Hormone replacement therapy is available to decrease symptoms and risks. You should talk to your caregiver about whether hormone replacement therapy is right for you.  Use sunscreen. Apply sunscreen liberally and repeatedly throughout the day. You should seek shade when your shadow is shorter than you. Protect yourself by wearing long sleeves, pants, a wide-brimmed hat, and sunglasses year round, whenever you are outdoors.  Notify your caregiver of new moles or changes in moles, especially if there is a change in shape or color. Also notify your caregiver if a mole is larger than the size of a pencil eraser.  Stay current with your immunizations. Document Released: 04/19/2011 Document Revised: 01/29/2013 Document Reviewed: 04/19/2011 [facility] Patient Information 2014 [employer].

## 2014-07-25 NOTE — Addendum Note (Signed)
Addended by: Dayna BarkerGARDNER, KIMBERLY K on: 07/25/2014 12:30 PM   Modules accepted: Orders

## 2014-07-25 NOTE — Progress Notes (Signed)
Rylea R Lienemann 09/26/1966 [id number]        614Jama Flavors48 y.o.  G2P2002 for annual exam.  Doing well without complaints.  Past medical history,surgical history, problem list, medications, allergies, family history and social history were all reviewed and documented as reviewed in the EPIC chart.  ROS:  12 system ROS performed with pertinent positives and negatives included in the history, assessment and plan.   Additional significant findings :  none   Exam: [facility] person[facility]istant Filed Vitals:   07/25/14 1206  BP: 110/66  Height: 5\' 8"  (1.727 m)  Weight: 136 lb (61.689 kg)   General appearance:  Normal affect, orientation and appearance. Skin: Grossly normal HEENT: Without gross lesions.  No cervical or supraclavicular adenopathy. Thyroid normal.  Lungs:  Clear without wheezing, rales or rhonchi Cardiac: RR, without RMG Abdominal:  Soft, nontender, without masses, guarding, rebound, organomegaly or hernia Breasts:  Examined lying and sitting without masses, retractions, discharge or axillary adenopathy. Pelvic:  Ext/BUS/vagina normal  Cervix normal. Pap/HPV  Uterus anteverted, normal size, shape and contour, midline and mobile nontender   Adnexa  Without masses or tenderness    Anus and perineum  Normal   Rectovaginal  Normal sphincter tone without palpated masses or tenderness.    Assessment/Plan:  48 y.o. 372P2002 female for annual exam with regular menses, condom birth control.   1. Contraception. I again discussed the failure risk with condoms. Availability of Plan B reviewed. Patient not interested in trying anything else.   2. Mammography 01/2014. Had biopsy of small area which turned out to be a sebaceous cyst. Continue with annual mammography. SBE monthly reviewed. 3. Pap smear 2012. Pap/HPV today. No history of significant abnormal Pap smears previously. Plan repeat Pap smear in 3-5 your intervals and the specimen was normal per current screening guidelines. 4. Health maintenance. Patient  recently had routine blood work done through her employer to include a normal CBC comprehensive metabolic panel lipid profile TSH hemoglobin A1c.  Follow up in one year, sooner as needed.     Dara LordsFONTAINE,Dwyne Hasegawa P MD, 12:21 PM 07/25/2014

## 2014-07-26 LAB — URINALYSIS W MICROSCOPIC + REFLEX CULTURE
BACTERIA UA: NONE SEEN
BILIRUBIN URINE: NEGATIVE
CASTS: NONE SEEN
CRYSTALS: NONE SEEN
Glucose, UA: NEGATIVE mg/dL
HGB URINE DIPSTICK: NEGATIVE
KETONES UR: NEGATIVE mg/dL
Nitrite: NEGATIVE
PH: 7 (ref 5.0–8.0)
Protein, ur: NEGATIVE mg/dL
SPECIFIC GRAVITY, URINE: 1.012 (ref 1.005–1.030)
Squamous Epithelial / LPF: NONE SEEN
UROBILINOGEN UA: 0.2 mg/dL (ref 0.0–1.0)

## 2014-07-29 ENCOUNTER — Other Ambulatory Visit: Payer: Self-pay | Admitting: Gynecology

## 2014-07-29 LAB — URINE CULTURE: Colony Count: 65000

## 2014-07-29 MED ORDER — AMPICILLIN 500 MG PO CAPS: 500.0000 mg | ORAL_CAPSULE | Freq: Four times a day (QID) | ORAL | Status: DC

## 2014-07-30 LAB — CYTOLOGY - PAP

## 2014-08-02 ENCOUNTER — Telehealth: Payer: Self-pay | Admitting: *Deleted

## 2014-08-02 MED ORDER — FLUCONAZOLE 150 MG PO TABS: 150.0000 mg | ORAL_TABLET | Freq: Once | ORAL | Status: DC

## 2014-08-02 NOTE — Telephone Encounter (Signed)
Please call, ampicillin best drug for UTI bacteria present, had no blood in the urine at office visit, ask if she thinks blood is urinary or externally from irritation. If external call in Diflucan 150, yeast common after penicillin-based drugs.

## 2014-08-02 NOTE — Telephone Encounter (Signed)
Pt informed with the below note, rx sent. 

## 2014-08-02 NOTE — Telephone Encounter (Signed)
(  Dr.Fontaine patient )Pt treated for UTI on 07/25/14 on her 4 th day of pills ampicillin 500 mg qid x 5 days noticed when wiping only small trace of blood. Pt said typically she would have trace of blood early onset in UTI. Pt does have tenderness. Please advise

## 2014-08-19 ENCOUNTER — Encounter: Payer: Self-pay | Admitting: Gynecology

## 2014-12-10 ENCOUNTER — Ambulatory Visit (HOSPITAL_COMMUNITY)
Admission: RE | Admit: 2014-12-10 | Discharge: 2014-12-10 | Disposition: A | Discharge status: Home / Self Care | Payer: BLUE CROSS/BLUE SHIELD | Source: Ambulatory Visit | Attending: Physician Assistant | Admitting: Physician Assistant

## 2014-12-10 ENCOUNTER — Other Ambulatory Visit (HOSPITAL_COMMUNITY): Payer: Self-pay | Admitting: Physician Assistant

## 2014-12-10 DIAGNOSIS — S92301A Fracture of unspecified metatarsal bone(s), right foot, initial encounter for closed fracture: Secondary | ICD-10-CM

## 2014-12-10 DIAGNOSIS — S92301S Fracture of unspecified metatarsal bone(s), right foot, sequela: Secondary | ICD-10-CM

## 2014-12-10 DIAGNOSIS — X58XXXD Exposure to other specified factors, subsequent encounter: Secondary | ICD-10-CM | POA: Insufficient documentation

## 2014-12-10 DIAGNOSIS — S92514D Nondisplaced fracture of proximal phalanx of right lesser toe(s), subsequent encounter for fracture with routine healing: Secondary | ICD-10-CM | POA: Diagnosis not present

## 2014-12-25 ENCOUNTER — Other Ambulatory Visit: Payer: Self-pay

## 2014-12-25 DIAGNOSIS — Z1231 Encounter for screening mammogram for malignant neoplasm of breast: Secondary | ICD-10-CM

## 2015-01-28 ENCOUNTER — Encounter (INDEPENDENT_AMBULATORY_CARE_PROVIDER_SITE_OTHER): Payer: Self-pay

## 2015-01-28 ENCOUNTER — Ambulatory Visit
Admission: RE | Admit: 2015-01-28 | Discharge: 2015-01-28 | Disposition: A | Discharge status: Home / Self Care | Payer: BLUE CROSS/BLUE SHIELD | Source: Ambulatory Visit

## 2015-01-28 DIAGNOSIS — Z1231 Encounter for screening mammogram for malignant neoplasm of breast: Secondary | ICD-10-CM

## 2015-03-14 ENCOUNTER — Telehealth: Payer: Self-pay | Admitting: *Deleted

## 2015-03-14 ENCOUNTER — Encounter: Payer: Self-pay | Admitting: Women's Health

## 2015-03-14 ENCOUNTER — Ambulatory Visit (INDEPENDENT_AMBULATORY_CARE_PROVIDER_SITE_OTHER): Payer: BLUE CROSS/BLUE SHIELD | Admitting: Women's Health

## 2015-03-14 ENCOUNTER — Other Ambulatory Visit: Payer: Self-pay | Admitting: Women's Health

## 2015-03-14 VITALS — BP 130/80 | Ht 68.0 in | Wt 139.0 lb

## 2015-03-14 DIAGNOSIS — N6313 Unspecified lump in the right breast, lower outer quadrant: Secondary | ICD-10-CM

## 2015-03-14 DIAGNOSIS — N6310 Unspecified lump in the right breast, unspecified quadrant: Secondary | ICD-10-CM

## 2015-03-14 DIAGNOSIS — N63 Unspecified lump in unspecified breast: Secondary | ICD-10-CM

## 2015-03-14 NOTE — Telephone Encounter (Signed)
pt called c/o palpable area of concern in right breast pea size lump as noted on last diagnosis mammogram last year. Asked if order could be placed for dig.mammogram and possible U/S pt advise she could come see nancy young today, transferred to front desk

## 2015-03-14 NOTE — Progress Notes (Signed)
Patient ID: [name], female   DOB: 04/24/1966, 49 y.o.   MRN: [id number] Presents with complaint of new onset firm nodule inner aspect of right breast, noted in the shower today. Tender with palpation. History of sebaceous cyst in same area. Normal 3D tomography mammogram 01/28/2015. Monthly cycle/condoms. No other complaints.  Exam: Breast exam and sitting and lying position 2 cm mobile, slightly tender superficial nodule inner aspect of right breast at 7:00 position. No other palpable nodules, dimpling or retractions noted.  Probable 2 cm sebaceous cyst right inner breast  Plan: Ultrasound scheduled for 03/19/2015. Continue SBE's and report changes.

## 2015-03-14 NOTE — Telephone Encounter (Signed)
-----   Message from Harrington ChallengerNancy J Young, NP sent at 03/14/2015 12:30 PM EDT ----- Please schedule breast ultrasound for 2 cm mobile firm nodule inner aspect of right breast at 7:00 position. Normal 3-D tomography mammogram 01/2015.

## 2015-03-14 NOTE — Telephone Encounter (Signed)
Pt will come on 03/19/15 @ 1:20pm pt aware

## 2015-03-18 ENCOUNTER — Other Ambulatory Visit: Payer: Self-pay

## 2015-03-18 ENCOUNTER — Other Ambulatory Visit: Payer: Self-pay | Admitting: Women's Health

## 2015-03-18 DIAGNOSIS — N6313 Unspecified lump in the right breast, lower outer quadrant: Secondary | ICD-10-CM

## 2015-03-19 ENCOUNTER — Ambulatory Visit
Admission: RE | Admit: 2015-03-19 | Discharge: 2015-03-19 | Disposition: A | Discharge status: Home / Self Care | Payer: BLUE CROSS/BLUE SHIELD | Source: Ambulatory Visit | Attending: Women's Health | Admitting: Women's Health

## 2015-03-19 DIAGNOSIS — N6313 Unspecified lump in the right breast, lower outer quadrant: Secondary | ICD-10-CM

## 2015-09-02 ENCOUNTER — Other Ambulatory Visit: Payer: Self-pay | Admitting: Gynecology

## 2015-09-02 ENCOUNTER — Other Ambulatory Visit: Payer: Self-pay

## 2015-09-02 DIAGNOSIS — R921 Mammographic calcification found on diagnostic imaging of breast: Secondary | ICD-10-CM

## 2015-09-19 ENCOUNTER — Ambulatory Visit
Admission: RE | Admit: 2015-09-19 | Discharge: 2015-09-19 | Disposition: A | Discharge status: Home / Self Care | Payer: BLUE CROSS/BLUE SHIELD | Source: Ambulatory Visit | Attending: Gynecology | Admitting: Gynecology

## 2015-09-19 ENCOUNTER — Other Ambulatory Visit: Payer: Self-pay | Admitting: Gynecology

## 2015-09-19 DIAGNOSIS — N6310 Unspecified lump in the right breast, unspecified quadrant: Secondary | ICD-10-CM

## 2015-09-19 DIAGNOSIS — R921 Mammographic calcification found on diagnostic imaging of breast: Secondary | ICD-10-CM

## 2016-01-30 ENCOUNTER — Other Ambulatory Visit: Payer: Self-pay

## 2016-01-30 DIAGNOSIS — Z1231 Encounter for screening mammogram for malignant neoplasm of breast: Secondary | ICD-10-CM

## 2016-02-02 ENCOUNTER — Ambulatory Visit
Admission: RE | Admit: 2016-02-02 | Discharge: 2016-02-02 | Disposition: A | Discharge status: Home / Self Care | Payer: BLUE CROSS/BLUE SHIELD | Source: Ambulatory Visit

## 2016-02-02 DIAGNOSIS — Z1231 Encounter for screening mammogram for malignant neoplasm of breast: Secondary | ICD-10-CM

## 2016-11-03 ENCOUNTER — Encounter: Payer: BLUE CROSS/BLUE SHIELD | Admitting: Women's Health

## 2016-11-04 DIAGNOSIS — H10023 Other mucopurulent conjunctivitis, bilateral: Secondary | ICD-10-CM | POA: Diagnosis not present

## 2016-11-11 ENCOUNTER — Encounter: Payer: Self-pay | Admitting: Women's Health

## 2016-11-11 ENCOUNTER — Ambulatory Visit (INDEPENDENT_AMBULATORY_CARE_PROVIDER_SITE_OTHER): Payer: BLUE CROSS/BLUE SHIELD | Admitting: Women's Health

## 2016-11-11 VITALS — BP 114/78 | Ht 67.5 in | Wt 151.0 lb

## 2016-11-11 DIAGNOSIS — G43001 Migraine without aura, not intractable, with status migrainosus: Secondary | ICD-10-CM | POA: Diagnosis not present

## 2016-11-11 DIAGNOSIS — R519 Headache, unspecified: Secondary | ICD-10-CM

## 2016-11-11 DIAGNOSIS — Z01419 Encounter for gynecological examination (general) (routine) without abnormal findings: Secondary | ICD-10-CM | POA: Diagnosis not present

## 2016-11-11 DIAGNOSIS — R51 Headache: Secondary | ICD-10-CM | POA: Diagnosis not present

## 2016-11-11 DIAGNOSIS — Z1151 Encounter for screening for human papillomavirus (HPV): Secondary | ICD-10-CM | POA: Diagnosis not present

## 2016-11-11 MED ORDER — SUMATRIPTAN SUCCINATE 50 MG PO TABS: 50.0000 mg | ORAL_TABLET | ORAL | 0 refills | Status: DC | PRN

## 2016-11-11 NOTE — Patient Instructions (Signed)
Migraine Headache A migraine headache is an intense, throbbing pain on one side or both sides of the head. Migraines may also cause other symptoms, such as nausea, vomiting, and sensitivity to light and noise. What are the causes? Doing or taking certain things may also trigger migraines, such as:  Alcohol.  Smoking.  Medicines, such as:  Medicine used to treat chest pain (nitroglycerine).  Birth control pills.  Estrogen pills.  Certain blood pressure medicines.  Aged cheeses, chocolate, or caffeine.  Foods or drinks that contain nitrates, glutamate, aspartame, or tyramine.  Physical activity. Other things that may trigger a migraine include:  Menstruation.  Pregnancy.  Hunger.  Stress, lack of sleep, too much sleep, or fatigue.  Weather changes. What increases the risk? The following factors may make you more likely to experience migraine headaches:  Age. Risk increases with age.  Family history of migraine headaches.  Being Caucasian.  Depression and anxiety.  Obesity.  Being a woman.  Having a hole in the heart (patent foramen ovale) or other heart problems. What are the signs or symptoms? The main symptom of this condition is pulsating or throbbing pain. Pain may:  Happen in any area of the head, such as on one side or both sides.  Interfere with daily activities.  Get worse with physical activity.  Get worse with exposure to bright lights or loud noises. Other symptoms may include:  Nausea.  Vomiting.  Dizziness.  General sensitivity to bright lights, loud noises, or smells. Before you get a migraine, you may get warning signs that a migraine is developing (aura). An aura may include:  Seeing flashing lights or having blind spots.  Seeing bright spots, halos, or zigzag lines.  Having tunnel vision or blurred vision.  Having numbness or a tingling feeling.  Having trouble talking.  Having muscle weakness. How is this diagnosed? A  migraine headache can be diagnosed based on:  Your symptoms.  A physical exam.  Tests, such as CT scan or MRI of the head. These imaging tests can help rule out other causes of headaches.  Taking fluid from the spine (lumbar puncture) and analyzing it (cerebrospinal fluid analysis, or CSF analysis). How is this treated? A migraine headache is usually treated with medicines that:  Relieve pain.  Relieve nausea.  Prevent migraines from coming back. Treatment may also include:  Acupuncture.  Lifestyle changes like avoiding foods that trigger migraines. Follow these instructions at home: Medicines  Take over-the-counter and prescription medicines only as told by your health care provider.  Do not drive or use heavy machinery while taking prescription pain medicine.  To prevent or treat constipation while you are taking prescription pain medicine, your health care provider may recommend that you:  Drink enough fluid to keep your urine clear or pale yellow.  Take over-the-counter or prescription medicines.  Eat foods that are high in fiber, such as fresh fruits and vegetables, whole grains, and beans.  Limit foods that are high in fat and processed sugars, such as fried and sweet foods. Lifestyle  Avoid alcohol use.  Do not use any products that contain nicotine or tobacco, such as cigarettes and e-cigarettes. If you need help quitting, ask your health care provider.  Get at least 8 hours of sleep every night.  Limit your stress. General instructions  Keep a journal to find out what may trigger your migraine headaches. For example, write down:  What you eat and drink.  How much sleep you get.  Any change  to your diet or medicines.  If you have a migraine:  Avoid things that make your symptoms worse, such as bright lights.  It may help to lie down in a dark, quiet room.  Do not drive or use heavy machinery.  Ask your health care provider what activities are  safe for you while you are experiencing symptoms.  Keep all follow-up visits as told by your health care provider. This is important. Contact a health care provider if:  You develop symptoms that are different or more severe than your usual migraine symptoms. Get help right away if:  Your migraine becomes severe.  You have a fever.  You have a stiff neck.  You have vision loss.  Your muscles feel weak or like you cannot control them.  You start to lose your balance often.  You develop trouble walking.  You faint. This information is not intended to replace advice given to you by your health care provider. Make sure you discuss any questions you have with your health care provider. Document Released: 10/04/2005 Document Revised: 04/23/2016 Document Reviewed: 03/22/2016 Elsevier Interactive Patient Education  2017 [employer], Female Introduction Adopting a healthy lifestyle and getting preventive care can go a long way to promote health and wellness. Talk with your health care provider about what schedule of regular examinations is right for you. This is a good chance for you to check in with your provider about disease prevention and staying healthy. In between checkups, there are plenty of things you can do on your own. Experts have done a lot of research about which lifestyle changes and preventive measures are most likely to keep you healthy. Ask your health care provider for more information. Weight and diet Eat a healthy diet  Be sure to include plenty of vegetables, fruits, low-fat dairy products, and lean protein.  Do not eat a lot of foods high in solid fats, added sugars, or salt.  Get regular exercise. This is one of the most important things you can do for your health.  Most adults should exercise for at least 150 minutes each week. The exercise should increase your heart rate and make you sweat (moderate-intensity exercise).  Most adults  should also do strengthening exercises at least twice a week. This is in addition to the moderate-intensity exercise. Maintain a healthy weight  Body mass index (BMI) is a measurement that can be used to identify possible weight problems. It estimates body fat based on height and weight. Your health care provider can help determine your BMI and help you achieve or maintain a healthy weight.  For females 70 years of age and older:  A BMI below 18.5 is considered underweight.  A BMI of 18.5 to 24.9 is normal.  A BMI of 25 to 29.9 is considered overweight.  A BMI of 30 and above is considered obese. Watch levels of cholesterol and blood lipids  You should start having your blood tested for lipids and cholesterol at 49 years of age, then have this test every 5 years.  You may need to have your cholesterol levels checked more often if:  Your lipid or cholesterol levels are high.  You are older than 49 years of age.  You are at high risk for heart disease. Cancer screening Lung Cancer  Lung cancer screening is recommended for adults 58-41 years old who are at high risk for lung cancer because of a history of smoking.  A yearly low-dose CT scan of the  lungs is recommended for people who:  Currently smoke.  Have quit within the past 15 years.  Have at least a 30-pack-year history of smoking. A pack year is smoking an average of one pack of cigarettes a day for 1 year.  Yearly screening should continue until it has been 15 years since you quit.  Yearly screening should stop if you develop a health problem that would prevent you from having lung cancer treatment. Breast Cancer  Practice breast self-awareness. This means understanding how your breasts normally appear and feel.  It also means doing regular breast self-exams. Let your health care provider know about any changes, no matter how small.  If you are in your 20s or 30s, you should have a clinical breast exam (CBE) by a  health care provider every 1-3 years as part of a regular health exam.  If you are 66 or older, have a CBE every year. Also consider having a breast X-ray (mammogram) every year.  If you have a family history of breast cancer, talk to your health care provider about genetic screening.  If you are at high risk for breast cancer, talk to your health care provider about having an MRI and a mammogram every year.  Breast cancer gene (BRCA) assessment is recommended for women who have family members with BRCA-related cancers. BRCA-related cancers include:  Breast.  Ovarian.  Tubal.  Peritoneal cancers.  Results of the assessment will determine the need for genetic counseling and BRCA1 and BRCA2 testing. Cervical Cancer  Your health care provider may recommend that you be screened regularly for cancer of the pelvic organs (ovaries, uterus, and vagina). This screening involves a pelvic examination, including checking for microscopic changes to the surface of your cervix (Pap test). You may be encouraged to have this screening done every 3 years, beginning at age 45.  For women ages 8-65, health care providers may recommend pelvic exams and Pap testing every 3 years, or they may recommend the Pap and pelvic exam, combined with testing for human papilloma virus (HPV), every 5 years. Some types of HPV increase your risk of cervical cancer. Testing for HPV may also be done on women of any age with unclear Pap test results.  Other health care providers may not recommend any screening for nonpregnant women who are considered low risk for pelvic cancer and who do not have symptoms. Ask your health care provider if a screening pelvic exam is right for you.  If you have had past treatment for cervical cancer or a condition that could lead to cancer, you need Pap tests and screening for cancer for at least 20 years after your treatment. If Pap tests have been discontinued, your risk factors (such as having  a new sexual partner) need to be reassessed to determine if screening should resume. Some women have medical problems that increase the chance of getting cervical cancer. In these cases, your health care provider may recommend more frequent screening and Pap tests. Colorectal Cancer  This type of cancer can be detected and often prevented.  Routine colorectal cancer screening usually begins at 49 years of age and continues through 49 years of age.  Your health care provider may recommend screening at an earlier age if you have risk factors for colon cancer.  Your health care provider may also recommend using home test kits to check for hidden blood in the stool.  A small camera at the end of a tube can be used to examine your  colon directly (sigmoidoscopy or colonoscopy). This is done to check for the earliest forms of colorectal cancer.  Routine screening usually begins at age 52.  Direct examination of the colon should be repeated every 5-10 years through 49 years of age. However, you may need to be screened more often if early forms of precancerous polyps or small growths are found. Skin Cancer  Check your skin from head to toe regularly.  Tell your health care provider about any new moles or changes in moles, especially if there is a change in a mole's shape or color.  Also tell your health care provider if you have a mole that is larger than the size of a pencil eraser.  Always use sunscreen. Apply sunscreen liberally and repeatedly throughout the day.  Protect yourself by wearing long sleeves, pants, a wide-brimmed hat, and sunglasses whenever you are outside. Heart disease, diabetes, and high blood pressure  High blood pressure causes heart disease and increases the risk of stroke. High blood pressure is more likely to develop in:  People who have blood pressure in the high end of the normal range (130-139/85-89 mm Hg).  People who are overweight or obese.  People who are  African American.  If you are 68-49 years of age, have your blood pressure checked every 3-5 years. If you are 29 years of age or older, have your blood pressure checked every year. You should have your blood pressure measured twice-once when you are at a hospital or clinic, and once when you are not at a hospital or clinic. Record the average of the two measurements. To check your blood pressure when you are not at a hospital or clinic, you can use:  An automated blood pressure machine at a pharmacy.  A home blood pressure monitor.  If you are between 45 years and 50 years old, ask your health care provider if you should take aspirin to prevent strokes.  Have regular diabetes screenings. This involves taking a blood sample to check your fasting blood sugar level.  If you are at a normal weight and have a low risk for diabetes, have this test once every three years after 49 years of age.  If you are overweight and have a high risk for diabetes, consider being tested at a younger age or more often. Preventing infection Hepatitis B  If you have a higher risk for hepatitis B, you should be screened for this virus. You are considered at high risk for hepatitis B if:  You were born in a country where hepatitis B is common. Ask your health care provider which countries are considered high risk.  Your parents were born in a high-risk country, and you have not been immunized against hepatitis B (hepatitis B vaccine).  You have HIV or AIDS.  You use needles to inject street drugs.  You live with someone who has hepatitis B.  You have had sex with someone who has hepatitis B.  You get hemodialysis treatment.  You take certain medicines for conditions, including cancer, organ transplantation, and autoimmune conditions. Hepatitis C  Blood testing is recommended for:  Everyone born from [birth year] through 1963.  Anyone with known risk factors for hepatitis C. Sexually transmitted infections  (STIs)  You should be screened for sexually transmitted infections (STIs) including gonorrhea and chlamydia if:  You are sexually active and are younger than 49 years of age.  You are older than 50 years of age and your health care provider tells you  that you are at risk for this type of infection.  Your sexual activity has changed since you were last screened and you are at an increased risk for chlamydia or gonorrhea. Ask your health care provider if you are at risk.  If you do not have HIV, but are at risk, it may be recommended that you take a prescription medicine daily to prevent HIV infection. This is called pre-exposure prophylaxis (PrEP). You are considered at risk if:  You are sexually active and do not regularly use condoms or know the HIV status of your partner(s).  You take drugs by injection.  You are sexually active with a partner who has HIV. Talk with your health care provider about whether you are at high risk of being infected with HIV. If you choose to begin PrEP, you should first be tested for HIV. You should then be tested every 3 months for as long as you are taking PrEP. Pregnancy  If you are premenopausal and you may become pregnant, ask your health care provider about preconception counseling.  If you may become pregnant, take 400 to 800 micrograms (mcg) of folic acid every day.  If you want to prevent pregnancy, talk to your health care provider about birth control (contraception). Osteoporosis and menopause  Osteoporosis is a disease in which the bones lose minerals and strength with aging. This can result in serious bone fractures. Your risk for osteoporosis can be identified using a bone density scan.  If you are 57 years of age or older, or if you are at risk for osteoporosis and fractures, ask your health care provider if you should be screened.  Ask your health care provider whether you should take a calcium or vitamin D supplement to lower your risk  for osteoporosis.  Menopause may have certain physical symptoms and risks.  Hormone replacement therapy may reduce some of these symptoms and risks. Talk to your health care provider about whether hormone replacement therapy is right for you. Follow these instructions at home:  Schedule regular health, dental, and eye exams.  Stay current with your immunizations.  Do not use any tobacco products including cigarettes, chewing tobacco, or electronic cigarettes.  If you are pregnant, do not drink alcohol.  If you are breastfeeding, limit how much and how often you drink alcohol.  Limit alcohol intake to no more than 1 drink per day for nonpregnant women. One drink equals 12 ounces of beer, 5 ounces of wine, or 1 ounces of hard liquor.  Do not use street drugs.  Do not share needles.  Ask your health care provider for help if you need support or information about quitting drugs.  Tell your health care provider if you often feel depressed.  Tell your health care provider if you have ever been abused or do not feel safe at home. This information is not intended to replace advice given to you by your health care provider. Make sure you discuss any questions you have with your health care provider. Document Released: 04/19/2011 Document Revised: 03/11/2016 Document Reviewed: 07/08/2015  2017 Elsevier

## 2016-11-11 NOTE — Addendum Note (Signed)
Addended by: Berna SpareASTILLO, BLANCA A on: 11/11/2016 02:05 PM   Modules accepted: Orders

## 2016-11-11 NOTE — Progress Notes (Signed)
Jamie FlavorsLeah R Hale 11/23/1965 [id number]    History:    Presents for annual exam.  Mostly monthly cycle/condoms. Cycles last year were consistently monthly has had a few months where she has skipped. Has had increased headaches this past year that she relates to hormonal changes. Headaches, without are are more left-sided possibly tension related. Has had eye exams. Has had migraines in the past. Does have photophobia and has had some relief with over-the-counter medication. Normal Pap and mammogram history. History of benign breast biopsies. Labs at health screening at work.  Past medical history, past surgical history, family history and social history were all reviewed and documented in the EPIC chart. 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 Daughter [id number], son [id number], both doing well.  ROS:  A ROS was performed and pertinent positives and negatives are included.  Exam:  Vitals:   11/11/16 1209  BP: 114/78  Weight: 151 lb (68.5 kg)  Height: 5' 7.5" (1.715 m)   Body mass index is 23.3 kg/m.   General appearance:  Normal Thyroid:  Symmetrical, normal in size, without palpable masses or nodularity. Respiratory  Auscultation:  Clear without wheezing or rhonchi Cardiovascular  Auscultation:  Regular rate, without rubs, murmurs or gallops  Edema/varicosities:  Not grossly evident Abdominal  Soft,nontender, without masses, guarding or rebound.  Liver/spleen:  No organomegaly noted  Hernia:  None appreciated  Skin  Inspection:  Grossly normal   Breasts: Examined lying and sitting.     Right: Without masses, retractions, discharge or axillary adenopathy.     Left: Without masses, retractions, discharge or axillary adenopathy. Gentitourinary   Inguinal/mons:  Normal without inguinal adenopathy  External genitalia:  Normal  BUS/Urethra/Skene's glands:  Normal  Vagina:  Normal  Cervix:  Normal  Uterus:   normal in size, shape and contour.  Midline and mobile  Adnexa/parametria:     Rt: Without masses or  tenderness.   Lt: Without masses or tenderness.  Anus and perineum: Normal  Digital rectal exam: Normal sphincter tone without palpated masses or tenderness  Assessment/Plan:  51 y.o. MWF G2 P2 for annual exam with complaint of increased headaches, difficulty sleeping over the past year.    Perimen[facility]condoms [facility]  Plan: Headache wellness Center evaluation reviewed, would like to try medication first, will try Imitrex 50 mg at onset of headache repeat in 2 hours if needed no more than 2 tablets in 24 hours, prescription, proper use given and reviewed. If headaches persist or increase or change instructed to call or schedule appointment with headache wellness center for evaluation. Menopause, sleep hygiene reviewed. SBE's, continue annual 3-D screening mammogram, calcium rich diet, vitamin D 1000 daily encouraged. Instructed to have vitamin D level checked at health screening at work. Pap with HR HPV typing, new screening guidelines reviewed. Instructed to fax lab results from health screening.    Jamie Hale,Jamie Hale, 12:50 PM 11/11/2016

## 2016-11-12 LAB — PAP, TP IMAGING W/ HPV RNA, RFLX HPV TYPE 16,18/45: HPV mRNA, High Risk: NOT DETECTED

## 2016-11-18 DIAGNOSIS — H10413 Chronic giant papillary conjunctivitis, bilateral: Secondary | ICD-10-CM | POA: Diagnosis not present

## 2017-01-24 ENCOUNTER — Other Ambulatory Visit: Payer: Self-pay | Admitting: Gynecology

## 2017-01-24 DIAGNOSIS — Z1231 Encounter for screening mammogram for malignant neoplasm of breast: Secondary | ICD-10-CM

## 2017-02-10 ENCOUNTER — Ambulatory Visit
Admission: RE | Admit: 2017-02-10 | Discharge: 2017-02-10 | Disposition: A | Discharge status: Home / Self Care | Payer: BLUE CROSS/BLUE SHIELD | Source: Ambulatory Visit | Attending: Gynecology | Admitting: Gynecology

## 2017-02-10 DIAGNOSIS — Z1231 Encounter for screening mammogram for malignant neoplasm of breast: Secondary | ICD-10-CM | POA: Diagnosis not present

## 2017-06-14 ENCOUNTER — Ambulatory Visit (INDEPENDENT_AMBULATORY_CARE_PROVIDER_SITE_OTHER): Payer: BLUE CROSS/BLUE SHIELD | Admitting: Women's Health

## 2017-06-14 ENCOUNTER — Encounter: Payer: Self-pay | Admitting: Women's Health

## 2017-06-14 DIAGNOSIS — G43001 Migraine without aura, not intractable, with status migrainosus: Secondary | ICD-10-CM

## 2017-06-14 MED ORDER — SUMATRIPTAN SUCCINATE 50 MG PO TABS: 50.0000 mg | ORAL_TABLET | ORAL | 0 refills | Status: DC | PRN

## 2017-06-14 NOTE — Patient Instructions (Signed)
Migraine Headache A migraine headache is an intense, throbbing pain on one side or both sides of the head. Migraines may also cause other symptoms, such as nausea, vomiting, and sensitivity to light and noise. What are the causes? Doing or taking certain things may also trigger migraines, such as:  Alcohol.  Smoking.  Medicines, such as: ? Medicine used to treat chest pain (nitroglycerine). ? Birth control pills. ? Estrogen pills. ? Certain blood pressure medicines.  Aged cheeses, chocolate, or caffeine.  Foods or drinks that contain nitrates, glutamate, aspartame, or tyramine.  Physical activity.  Other things that may trigger a migraine include:  Menstruation.  Pregnancy.  Hunger.  Stress, lack of sleep, too much sleep, or fatigue.  Weather changes.  What increases the risk? The following factors may make you more likely to experience migraine headaches:  Age. Risk increases with age.  Family history of migraine headaches.  Being Caucasian.  Depression and anxiety.  Obesity.  Being a woman.  Having a hole in the heart (patent foramen ovale) or other heart problems.  What are the signs or symptoms? The main symptom of this condition is pulsating or throbbing pain. Pain may:  Happen in any area of the head, such as on one side or both sides.  Interfere with daily activities.  Get worse with physical activity.  Get worse with exposure to bright lights or loud noises.  Other symptoms may include:  Nausea.  Vomiting.  Dizziness.  General sensitivity to bright lights, loud noises, or smells.  Before you get a migraine, you may get warning signs that a migraine is developing (aura). An aura may include:  Seeing flashing lights or having blind spots.  Seeing bright spots, halos, or zigzag lines.  Having tunnel vision or blurred vision.  Having numbness or a tingling feeling.  Having trouble talking.  Having muscle weakness.  How is this  diagnosed? A migraine headache can be diagnosed based on:  Your symptoms.  A physical exam.  Tests, such as CT scan or MRI of the head. These imaging tests can help rule out other causes of headaches.  Taking fluid from the spine (lumbar puncture) and analyzing it (cerebrospinal fluid analysis, or CSF analysis).  How is this treated? A migraine headache is usually treated with medicines that:  Relieve pain.  Relieve nausea.  Prevent migraines from coming back.  Treatment may also include:  Acupuncture.  Lifestyle changes like avoiding foods that trigger migraines.  Follow these instructions at home: Medicines  Take over-the-counter and prescription medicines only as told by your health care provider.  Do not drive or use heavy machinery while taking prescription pain medicine.  To prevent or treat constipation while you are taking prescription pain medicine, your health care provider may recommend that you: ? Drink enough fluid to keep your urine clear or pale yellow. ? Take over-the-counter or prescription medicines. ? Eat foods that are high in fiber, such as fresh fruits and vegetables, whole grains, and beans. ? Limit foods that are high in fat and processed sugars, such as fried and sweet foods. Lifestyle  Avoid alcohol use.  Do not use any products that contain nicotine or tobacco, such as cigarettes and e-cigarettes. If you need help quitting, ask your health care provider.  Get at least 8 hours of sleep every night.  Limit your stress. General instructions   Keep a journal to find out what may trigger your migraine headaches. For example, write down: ? What you eat and   drink. ? How much sleep you get. ? Any change to your diet or medicines.  If you have a migraine: ? Avoid things that make your symptoms worse, such as bright lights. ? It may help to lie down in a dark, quiet room. ? Do not drive or use heavy machinery. ? Ask your health care provider  what activities are safe for you while you are experiencing symptoms.  Keep all follow-up visits as told by your health care provider. This is important. Contact a health care provider if:  You develop symptoms that are different or more severe than your usual migraine symptoms. Get help right away if:  Your migraine becomes severe.  You have a fever.  You have a stiff neck.  You have vision loss.  Your muscles feel weak or like you cannot control them.  You start to lose your balance often.  You develop trouble walking.  You faint. This information is not intended to replace advice given to you by your health care provider. Make sure you discuss any questions you have with your health care provider. Document Released: 10/04/2005 Document Revised: 04/23/2016 Document Reviewed: 03/22/2016 Elsevier Interactive Patient Education  2017 Elsevier Inc.   

## 2017-06-14 NOTE — Progress Notes (Signed)
Presents with complaint of increasing frequency of left-sided headaches. Pain radiates from left temple to the back of her head,  numbness feeling on the left side of temple extending down towards her ear and neck. Symptoms are relieved by 1 Imitrex. Denies vertigo,  visual changes, has nausea without vomiting. Usually relieved with napping. Headaches started about 2 years ago every few months but have increased frequency over the last several months to about 8 headaches in the last 2 months. Has tried a gluten-free diet with no relief, not associated with menstrual cycles, increase frequency with fatigue. Denies change in routine.  Exam: Appears well. Headache free today.  Migraine headaches  Plan: Imitrex 50 mg take 1 tablet, can repeat in 2 hours if needed, #10, prescription refill given. Will schedule appointment with Dr. Vela Prose for evaluation. Keep migraine record for appointment

## 2017-06-17 ENCOUNTER — Other Ambulatory Visit: Payer: Self-pay | Admitting: Women's Health

## 2017-06-17 DIAGNOSIS — G43001 Migraine without aura, not intractable, with status migrainosus: Secondary | ICD-10-CM

## 2017-06-21 ENCOUNTER — Encounter: Payer: Self-pay | Admitting: Neurology

## 2017-06-21 ENCOUNTER — Telehealth: Payer: Self-pay | Admitting: *Deleted

## 2017-06-21 DIAGNOSIS — G43909 Migraine, unspecified, not intractable, without status migrainosus: Secondary | ICD-10-CM

## 2017-06-21 NOTE — Telephone Encounter (Signed)
-----   Message from Harrington ChallengerNancy J Young, NP sent at 06/14/2017  4:09 PM EDT ----- Needs an appointment with Dr. Vela ProseLeWitt for migraine evaluation. Migraines started about 2 years ago but have increased in frequency over the past few months. She gets nausea with vomiting, neck pain,  some numbness on the left side of her face and  left ear. Relieved with Imitrex but frequency has increased. Anytime is okay other than 9/6, and 06/24/2017.

## 2017-06-21 NOTE — Telephone Encounter (Signed)
I tried to call Dr.Lewitt office but the number listed has been disconnected, no other number to call. I spoke with patient and told her I was not able to get in contact with his office and okay if referral to [facility] was okay. Pt was fine with this, referral placed they will contact pt to schedule.

## 2017-06-30 NOTE — Telephone Encounter (Signed)
Appointment on 09/30/17 @ 8am with Dr.Jaffe

## 2017-09-28 ENCOUNTER — Encounter: Payer: Self-pay | Admitting: Women's Health

## 2017-09-28 ENCOUNTER — Ambulatory Visit (INDEPENDENT_AMBULATORY_CARE_PROVIDER_SITE_OTHER): Payer: BLUE CROSS/BLUE SHIELD | Admitting: Women's Health

## 2017-09-28 VITALS — BP 120/78

## 2017-09-28 DIAGNOSIS — R350 Frequency of micturition: Secondary | ICD-10-CM

## 2017-09-28 DIAGNOSIS — N898 Other specified noninflammatory disorders of vagina: Secondary | ICD-10-CM | POA: Diagnosis not present

## 2017-09-28 DIAGNOSIS — R102 Pelvic and perineal pain: Secondary | ICD-10-CM | POA: Diagnosis not present

## 2017-09-28 DIAGNOSIS — N912 Amenorrhea, unspecified: Secondary | ICD-10-CM

## 2017-09-28 DIAGNOSIS — R140 Abdominal distension (gaseous): Secondary | ICD-10-CM

## 2017-09-28 LAB — WET PREP FOR TRICH, YEAST, CLUE

## 2017-09-28 MED ORDER — NORETHIN-ETH ESTRAD-FE BIPHAS 1 MG-10 MCG / 10 MCG PO TABS: 1.0000 | ORAL_TABLET | Freq: Every day | ORAL | 1 refills | Status: DC

## 2017-09-28 NOTE — Progress Notes (Signed)
51 year old MWF G 2 P2 Presents with complaint of increased low abdominal bloating especially after eating, pressure in the suprapubic area, poor sleep, generally not feeling as well as well as she once did for the past few months since not having irregular cycle. Denies vaginal discharge with odor or itching, pain or burning with urination only a pressure sensation mostly in the bladder area. Denies fever, constipation, or nausea. Last cycle greater than 2 months ago, condoms for contraception.  Exam: Appears well. Abdomen soft without rebound or radiation, pressure sensation suprapubic area with deep palpation. External genitalia within normal limits, speculum exam white discharge without odor or erythema noted, wet prep negative. Bimanual no CMT, adnexal tenderness or fullness. UA negative, negative nitrites, leukocytes,  no WBCs or RBCs noted.  Perimenopausal Pelvic pressure  Plan: FSH, hCG qualitative. Reviewed low dose OCPs, lo Loestrin prescription, proper use, slight risk for blood clots and strokes reviewed. Has used OCs in the past without problem. Will triage based on lab results. If no relief of symptoms with OCPs will proceed with ultrasound.

## 2017-09-28 NOTE — Patient Instructions (Signed)
Menopause Menopause is the normal time of life when menstrual periods stop completely. Menopause is complete when you have missed 12 consecutive menstrual periods. It usually occurs between the ages of 48 years and 55 years. Very rarely does a woman develop menopause before the age of 40 years. At menopause, your ovaries stop producing the female hormones estrogen and progesterone. This can cause undesirable symptoms and also affect your health. Sometimes the symptoms may occur 4-5 years before the menopause begins. There is no relationship between menopause and:  Oral contraceptives.  Number of children you had.  Race.  The age your menstrual periods started (menarche).  Heavy smokers and very thin women may develop menopause earlier in life. What are the causes?  The ovaries stop producing the female hormones estrogen and progesterone. Other causes include:  Surgery to remove both ovaries.  The ovaries stop functioning for no known reason.  Tumors of the pituitary gland in the brain.  Medical disease that affects the ovaries and hormone production.  Radiation treatment to the abdomen or pelvis.  Chemotherapy that affects the ovaries.  What are the signs or symptoms?  Hot flashes.  Night sweats.  Decrease in sex drive.  Vaginal dryness and thinning of the vagina causing painful intercourse.  Dryness of the skin and developing wrinkles.  Headaches.  Tiredness.  Irritability.  Memory problems.  Weight gain.  Bladder infections.  Hair growth of the face and chest.  Infertility. More serious symptoms include:  Loss of bone (osteoporosis) causing breaks (fractures).  Depression.  Hardening and narrowing of the arteries (atherosclerosis) causing heart attacks and strokes.  How is this diagnosed?  When the menstrual periods have stopped for 12 straight months.  Physical exam.  Hormone studies of the blood. How is this treated? There are many treatment  choices and nearly as many questions about them. The decisions to treat or not to treat menopausal changes is an individual choice made with your health care provider. Your health care provider can discuss the treatments with you. Together, you can decide which treatment will work best for you. Your treatment choices may include:  Hormone therapy (estrogen and progesterone).  Non-hormonal medicines.  Treating the individual symptoms with medicine (for example antidepressants for depression).  Herbal medicines that may help specific symptoms.  Counseling by a psychiatrist or psychologist.  Group therapy.  Lifestyle changes including: ? Eating healthy. ? Regular exercise. ? Limiting caffeine and alcohol. ? Stress management and meditation.  No treatment.  Follow these instructions at home:  Take the medicine your health care provider gives you as directed.  Get plenty of sleep and rest.  Exercise regularly.  Eat a diet that contains calcium (good for the bones) and soy products (acts like estrogen hormone).  Avoid alcoholic beverages.  Do not smoke.  If you have hot flashes, dress in layers.  Take supplements, calcium, and vitamin D to strengthen bones.  You can use over-the-counter lubricants or moisturizers for vaginal dryness.  Group therapy is sometimes very helpful.  Acupuncture may be helpful in some cases. Contact a health care provider if:  You are not sure you are in menopause.  You are having menopausal symptoms and need advice and treatment.  You are still having menstrual periods after age 55 years.  You have pain with intercourse.  Menopause is complete (no menstrual period for 12 months) and you develop vaginal bleeding.  You need a referral to a specialist (gynecologist, psychiatrist, or psychologist) for treatment. Get help right   away if:  You have severe depression.  You have excessive vaginal bleeding.  You fell and think you have a  broken bone.  You have pain when you urinate.  You develop leg or chest pain.  You have a fast pounding heart beat (palpitations).  You have severe headaches.  You develop vision problems.  You feel a lump in your breast.  You have abdominal pain or severe indigestion. This information is not intended to replace advice given to you by your health care provider. Make sure you discuss any questions you have with your health care provider. Document Released: 12/25/2003 Document Revised: 03/11/2016 Document Reviewed: 05/03/2013 Elsevier Interactive Patient Education  2017 Elsevier Inc.  

## 2017-09-29 LAB — URINALYSIS W MICROSCOPIC + REFLEX CULTURE
BACTERIA UA: NONE SEEN /HPF
BILIRUBIN URINE: NEGATIVE
Glucose, UA: NEGATIVE
HYALINE CAST: NONE SEEN /LPF
Hgb urine dipstick: NEGATIVE
KETONES UR: NEGATIVE
LEUKOCYTE ESTERASE: NEGATIVE
Nitrites, Initial: NEGATIVE
PROTEIN: NEGATIVE
RBC / HPF: NONE SEEN /HPF (ref 0–2)
Specific Gravity, Urine: 1.004 (ref 1.001–1.03)
WBC, UA: NONE SEEN /HPF (ref 0–5)
pH: 6 (ref 5.0–8.0)

## 2017-09-29 LAB — FOLLICLE STIMULATING HORMONE: FSH: 8.1 m[IU]/mL

## 2017-09-29 LAB — HCG, QUANTITATIVE, PREGNANCY: HCG, Total, QN: <2 m[IU]/mL

## 2017-09-29 LAB — NO CULTURE INDICATED

## 2017-09-30 ENCOUNTER — Encounter: Payer: Self-pay | Admitting: Neurology

## 2017-09-30 ENCOUNTER — Ambulatory Visit (INDEPENDENT_AMBULATORY_CARE_PROVIDER_SITE_OTHER): Payer: BLUE CROSS/BLUE SHIELD | Admitting: Neurology

## 2017-09-30 VITALS — BP 90/64 | HR 73 | Ht 68.5 in | Wt 155.2 lb

## 2017-09-30 DIAGNOSIS — M542 Cervicalgia: Secondary | ICD-10-CM

## 2017-09-30 DIAGNOSIS — G43009 Migraine without aura, not intractable, without status migrainosus: Secondary | ICD-10-CM | POA: Diagnosis not present

## 2017-09-30 NOTE — Progress Notes (Addendum)
NEUROLOGY CONSULTATION NOTE  Jamie Hale MRN: [id number] DOB: 10-Jul-1966  Referring provider: Maryelizabeth Rowan, NP Primary care provider: Assunta Found, MD  Reason for consult:  headache  HISTORY OF PRESENT ILLNESS: Jamie Hale is a 51 year old female who presents for headache.  Onset:  March 2017. Location:  Left sided, frontal/occipital/retro-orbital Quality:  Focal pressure like a dagger (but not stabbing) Intensity:  severe Aura:  no Prodrome:  Fatigue one day prior Postdrome:  "hangover effect" for 1 to 2 days Associated symptoms:  Left facial/head numbness, photophobia, phonophobia, sometimes dizziness.  No nausea, vomiting or visual disturbance.  Denies double vision, slurred speech, language dysfunction, unsteady gait or unilateral weakness.  She has not had any new worse headache of her life, waking up from sleep Duration:  1 day Frequency:  Up until July 2018, they would occur once every 3 months.  From July to September, they occurred 3 days a month.  However, she hasn't had a headache since September. Frequency of abortive medication: rare Triggers/exacerbating factors:  Left sided neck pain, lack of sleep.  She is possibly pre-menopausal but it does not seem to be related. Relieving factors:  Laying down to rest or sleep Activity:  aggravates  Past NSAIDS:  ibuprofen Past analgesics:  Tylenol Past abortive triptans:  no Past muscle relaxants:  no Past anti-emetic:  no Past antihypertensive medications:  no Past antidepressant medications:  no Past anticonvulsant medications:  no Past vitamins/Herbal/Supplements:  no Past antihistamines/decongestants:  no Other past therapies:  no  Current NSAIDS:  no Current analgesics:  no Current triptans:  Sumatriptan 50mg  (does not abort headache but greatly reduces intensity so she is comfortable and functional) Current anti-emetic:  no Current muscle relaxants:  no Current anti-anxiolytic:  no Current sleep aide:   no Current Antihypertensive medications:  no Current Antidepressant medications:  no Current Anticonvulsant medications:  no Current Vitamins/Herbal/Supplements:  no Current Antihistamines/Decongestants:  no Other therapy:  no  Caffeine:  1 sweet tea daily Alcohol:  no Smoker:  no Diet:  Hydrates.  Follows diet.  Eats gluten-free products. Exercise:  routine Depression/anxiety:  no Sleep hygiene:  Varies.  Poor related to premenopausal symptoms. Denies prior history of headache. Family history of headache:  no  PAST MEDICAL HISTORY: Past Medical History:  Diagnosis Date  . Headache   . Seizure [facility])    eclamptic    PAST SURGICAL HISTORY: Past Surgical History:  Procedure Laterality Date  . BREAST BIOPSY Right 01/30/2014   benign  . BREAST SURGERY     Breast Bx benign  . GALLBLADDER SURGERY  08/2005  . KNEE ARTHROSCOPY  02/1987   bi-lat     MEDICATIONS: Current Outpatient Medications on File Prior to Visit  Medication Sig Dispense Refill  . Ascorbic Acid (VITAMIN C) 100 MG tablet Take 100 mg by mouth daily.    . cholecalciferol (VITAMIN D) 1000 UNITS tablet Take 1,000 Units by mouth daily.    . fish oil-omega-3 fatty acids 1000 MG capsule Take 2 g by mouth daily.    . Norethindrone-Ethinyl Estradiol-Fe Biphas (LO LOESTRIN FE) 1 MG-10 MCG / 10 MCG tablet Take 1 tablet by mouth daily. 3 Package 1  . SUMAtriptan (IMITREX) 50 MG tablet Take 1 tablet (50 mg total) by mouth every 2 (two) hours as needed for migraine. May repeat in 2 hours if headache persists or recurs. 10 tablet 0   No current facility-administered medications on file prior to visit.  ALLERGIES: No Known Allergies  FAMILY HISTORY: Family History  Problem Relation Age of Onset  . Breast cancer Paternal Grandmother [id number]  . Hypertension Mother   . Osteoarthritis Mother   . Hypertension Father   . Heart disease Father        quad bypass  . Breast cancer Paternal Aunt [id number]    SOCIAL  HISTORY: Social History   Socioeconomic History  . Marital status: Married    Spouse name: Tawanna Coolerodd  . Number of children: 2  . Years of education: Not on file  . Highest education level: Bachelor's degree (e.g., BA, AB, BS)  Social Needs  . Financial resource strain: Not on file  . Food insecurity - worry: Not on file  . Food insecurity - inability: Not on file  . Transportation needs - medical: Not on file  . Transportation needs - non-medical: Not on file  Occupational History  . Occupation: Technical brewerxecutive Assistant    Employer: [employer]  Tobacco Use  . Smoking status: Never Smoker  . Smokeless tobacco: Never Used  Substance and Sexual Activity  . Alcohol use: No  . Drug use: No  . Sexual activity: Yes    Partners: Male    Birth control/protection: Condom, Pill  Other Topics Concern  . Not on file  Social History Narrative   Married, lives with husband and 2 children in a one story home. Drinks one glass of sweet tea a day. Exercises 3-4 x a week.    REVIEW OF SYSTEMS: Constitutional: No fevers, chills, or sweats, no generalized fatigue, change in appetite Eyes: No visual changes, double vision, eye pain Ear, nose and throat: No hearing loss, ear pain, nasal congestion, sore throat Cardiovascular: No chest pain, palpitations Respiratory:  No shortness of breath at rest or with exertion, wheezes GastrointestinaI: No nausea, vomiting, diarrhea, abdominal pain, fecal incontinence Genitourinary:  No dysuria, urinary retention or frequency Musculoskeletal:  No neck pain, back pain Integumentary: No rash, pruritus, skin lesions Neurological: as above Psychiatric: No depression, insomnia, anxiety Endocrine: No palpitations, fatigue, diaphoresis, mood swings, change in appetite, change in weight, increased thirst Hematologic/Lymphatic:  No purpura, petechiae. Allergic/Immunologic: no itchy/runny eyes, nasal congestion, recent allergic reactions, rashes  PHYSICAL  EXAM: Vitals:   09/30/17 0803  BP: 90/64  Pulse: 73  SpO2: 98%   General: No acute distress.  Patient appears well-groomed.  Head:  Normocephalic/atraumatic Eyes:  fundi examined but not visualized Neck: supple, no paraspinal tenderness, decreased range of motion turning to left. Back: No paraspinal tenderness Heart: regular rate and rhythm Lungs: Clear to auscultation bilaterally. Vascular: No carotid bruits. Neurological Exam: Mental status: alert and oriented to person, place, and time, recent and remote memory intact, fund of knowledge intact, attention and concentration intact, speech fluent and not dysarthric, language intact. Cranial nerves: CN I: not tested CN II: pupils equal, round and reactive to light, visual fields intact CN III, IV, VI:  full range of motion, no nystagmus, no ptosis CN V: facial sensation intact CN VII: upper and lower face symmetric CN VIII: hearing intact CN IX, X: gag intact, uvula midline CN XI: sternocleidomastoid and trapezius muscles intact CN XII: tongue midline Bulk & Tone: normal, no fasciculations. Motor:  5/5 throughout  Sensation: temperature and vibration sensation intact. Deep Tendon Reflexes:  2+ throughout, toes downgoing.  Finger to nose testing:  Without dysmetria.  Heel to shin:  Without dysmetria.  Gait:  Normal station and stride.  Able to turn and tandem walk. Romberg negative.  IMPRESSION: Migraine without aura, stable.  Symptoms consistent with migraine.  She has had this for almost 2 years and her exam is normal.  Therefore, I am not concern about a secondary intracranial etiology such as mass lesion or aneurysm.  Cervicalgia.  A likely trigger for migraines.  PLAN: 1.  She has been migraine-free for 3 months, so a daily preventative is not warranted.  She will continue current healthy lifestyle of hydration and exercise.  I also provided sleep hygiene recommendations.  Also, she may try supplements (magnesium, B2,  CoQ10). 2.  She will use sumatriptan 50mg  as needed.  Suggested taking with naproxen 500mg , which may have synergistic effect. 3.  For neck pain, I will refer her for OMM. 4.  Follow up as needed.  Thank you for allowing me to take part in the care of this patient.  45 minutes spent face to face with patient, over 50% spent discussing diagnosis and management.  Shon MilletAdam Jaffe, DO  CC:  Assunta FoundJohn Golding, MD  Maryelizabeth RowanNancy Young, NP

## 2017-09-30 NOTE — Patient Instructions (Signed)
  1.  I will refer you to Sports Medicine for treatment of neck pain. 2.  Take sumatriptan at earliest onset of headache.  May repeat dose once in 2 hours if needed.  Do not exceed two tablets in 24 hours.  You may take sumatriptan with naproxen 500mg  3.  Limit use of pain relievers to no more than 2 days out of the week.  These medications include acetaminophen, ibuprofen, triptans and narcotics.  This will help reduce risk of rebound headaches. 4.  Be aware of common food triggers such as processed sweets, processed foods with nitrites (such as deli meat, hot dogs, sausages), foods with MSG, alcohol (such as wine), chocolate, certain cheeses, certain fruits (dried fruits, bananas, some citrus fruit), vinegar, diet soda. 4.  Avoid caffeine 5.  Routine exercise 6.  Proper sleep hygiene 7.  Stay adequately hydrated with water 8.  Keep a headache diary. 9.  Maintain proper stress management. 10.  Do not skip meals. 11.  Consider supplements:  Magnesium citrate 400mg  to 600mg  daily, riboflavin 400mg , Coenzyme Q 10 100mg  three times daily 12.  Follow up as needed.

## 2017-10-04 ENCOUNTER — Telehealth: Payer: Self-pay | Admitting: Neurology

## 2017-10-04 NOTE — Telephone Encounter (Signed)
Patient's daughter called and said that her mother is a patient of Dr. Karel JarvisAquino. She is in a rehab facility now but will be moving to and assisted living facility soon. She said her Dementia has gotten worse and she was wondering if there is anything that can be given to her to help with [place]? Please Call. Thanks

## 2017-10-04 NOTE — Telephone Encounter (Signed)
This is a pt of Dr. Moises BloodJaffe's, but he only saw her last week (new pt) for migraine...Jamie Hale..Jamie Hale

## 2017-10-04 NOTE — Telephone Encounter (Signed)
Brandy-This was about Pt Jamie PapaCarol Hale DOB: 01/11/1935. I've spoken with the Pts daughter Glorious PeachSarah Monroe, and I'm sending the original message to MintoMegan. Not sure what else to do to correct in chart.

## 2017-10-05 ENCOUNTER — Encounter: Payer: Self-pay | Admitting: Sports Medicine

## 2017-10-05 ENCOUNTER — Ambulatory Visit (INDEPENDENT_AMBULATORY_CARE_PROVIDER_SITE_OTHER): Payer: BLUE CROSS/BLUE SHIELD | Admitting: Sports Medicine

## 2017-10-05 VITALS — BP 114/66 | HR 69 | Ht 68.0 in | Wt 155.0 lb

## 2017-10-05 DIAGNOSIS — M542 Cervicalgia: Secondary | ICD-10-CM

## 2017-10-05 DIAGNOSIS — R51 Headache: Secondary | ICD-10-CM

## 2017-10-05 DIAGNOSIS — R519 Headache, unspecified: Secondary | ICD-10-CM

## 2017-10-05 DIAGNOSIS — M9901 Segmental and somatic dysfunction of cervical region: Secondary | ICD-10-CM

## 2017-10-05 DIAGNOSIS — M9902 Segmental and somatic dysfunction of thoracic region: Secondary | ICD-10-CM

## 2017-10-05 DIAGNOSIS — M9908 Segmental and somatic dysfunction of rib cage: Secondary | ICD-10-CM

## 2017-10-05 NOTE — Assessment & Plan Note (Signed)
Paraspinal muscle spasms with underlying likely mild degenerative change.  No significant radicular symptoms.  Osteopathic manipulation performed today and she responded well to this.  We will plan to follow-up with her in 4 weeks and see how she is progressing at that time.  In the interim we will have her begin working on therapeutic exercises as well as an inflatable home cervical traction device that is available over-the-counter.  Information was provided for this today.  If any lack of improvement can consider further diagnostic testing with plain film x-rays however at this time no red flag symptoms.

## 2017-10-05 NOTE — Procedures (Signed)
PROCEDURE NOTE : OSTEOPATHIC MANIPULATION The decision today to treat with Osteopathic Manipulative Therapy (OMT) was based on physical exam findings. Verbal consent was obtained after after explanation of risks, benefits and potential side effects, including acute pain flare, post manipulation soreness and need for repeat treatments.   Additional time was spent discussing the minimal risk of  injury to neurovascular structures for associated Cervical manipulation.  After verbal consent was obtained manipulation was performed as below:  Contraindications to OMT reviewed and include: NONE.             Regions treated: Per examined regions as below and associated billing codes          Techniques used: Muscle Energy, MFR, HVLA and ART The patient tolerated the treatment well and reported Improved symptoms following treatment today. Patient was given medications, exercises, stretches and lifestyle modifications per AVS and verbally.     OSTEOPATHIC/STRUCTURAL EXAM FINDINGS:    OA FRS right  C2 through C4 FRS left  Extended C6 on the rotated right  Elevated first rib on the left  Teeth 2 through T4 neutral rotated left, side bent right  Posterior rib 6 on the right

## 2017-10-05 NOTE — Procedures (Signed)
PROCEDURE NOTE: THERAPEUTIC EXERCISES (97110) 15 minutes spent for Therapeutic exercises as below and as referenced in the AVS. This included exercises focusing on stretching, strengthening, with significant focus on eccentric aspects.  Proper technique shown and discussed handout in great detail with ATC. All questions were discussed and answered.   Long term goals include an improvement in range of motion, strength, endurance as well as avoiding reinjury. Frequency of visits is one time as determined during today's  office visit. Frequency of exercises to be performed is as per handout.  EXERCISES REVIEWED:  Cervical stretching, towel exercises, postural stabilization exercises

## 2017-10-05 NOTE — Patient Instructions (Signed)
Also check out [employer]"Foundation Training" which is a program developed by Dr. Myles LippsEric Goodman.   There are links to a couple of his YouTube Videos below and I would like to see performing one of his videos 5-6 days per week.    A good intro video is: "Independence from Pain 7-minute Video" - [email or web address]   His more advanced video is: "Powerful Posture and Pain Relief: 12 minutes of Foundation Training" - [email or web address]://[email or web address].be/[email or web address]  Do not try to attempt this entire video when first beginning.    Try breaking of each exercise that he goes into shorter segments.  Otherwise if they perform an exercise for 45 seconds, start with 15 seconds and rest and then resume when they begin the new activity.    If you work your way up to doing this 12 minute video, I expect you will see significant improvements in your pain.  If you enjoy his videos and would like to find out more you can look on his website: [email or web address].  He has a workout streaming option as well as a DVD set available for purchase.  Amazon has the best price for his DVDs.     Please perform the exercise program that we have prepared for you and gone over in detail on a daily basis.  In addition to the handout you were provided you can access your program through: [email or web address]   Your unique program code is: [email or web address]

## 2017-10-05 NOTE — Telephone Encounter (Signed)
Error/ Original message ment for another Patient and Doctor/ Please Disregard

## 2017-10-05 NOTE — Progress Notes (Signed)
Jamie FellsMichael D. Hale Shinerigby, DO  [facility] [phone]  [facility]ie Hale Yetter - 51 y.o. female MRN [id number]  Date of birth: 04/26/1966  Visit Date: 10/05/2017  PCP: Jamie Hale, John, MD   Referred by: Jamie Hale, Jamie R, DO   Scribe for today's visit: Jamie FabianMolly Hale, ATC    SUBJECTIVE:  Jamie Hale Jamie Hale is here for New Patient (Initial Visit) (neck pain) Referred by Dr. Everlena Hale.    Her neck pain and decreased cervical rotation and migraine symptoms INITIALLY: Began a while ago w/ no specific MOI.  She is also having migraines since March 2017 and has been to see a neurologist last week (at [facility]) Described as mild burning pain in her neck w/ occasional stabbing pain w/ movement.  She also describes the pain as sore and bruised especially along her B UTs. Worsened with decreased sleep (migraine), upper body exercise and prolonged computer work especially if she's looking down. Improved with nothing noted. Additional associated symptoms include: no radiating pain or N/T into B UEs but does get some pain across her B UTs    At this time symptoms show no change compared to onset. She has tried anti-inflammatories and topical cream w/ no relief.    Has not had a cervical x-ray and has not been to PT.  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  Migraine pattern - L side from front to back and gets a numbness sensation in the L side of her face.  Last for 24 hours and has "hangover" symptoms for 1-2 days after.  Takes sumatriptan when she feels one coming on.   ROS Denies night time disturbances. Denies fevers, chills, or night sweats. Denies unexplained weight loss. Denies personal history of cancer. Denies changes in bowel or bladder habits. Denies recent unreported falls. Denies new or worsening dyspnea or wheezing. Reports headaches or dizziness. Just HA, no dizziness. Denies numbness, tingling or weakness  In the extremities.  Denies dizziness or  presyncopal episodes Denies lower extremity edema     HISTORY & PERTINENT PRIOR DATA:  Prior History reviewed and updated per electronic medical record.  Significant history, findings, studies and interim changes include:  reports that  has never smoked. she has never used smokeless tobacco. No results for input(s): HGBA1C, LABURIC, CREATINE in the last 8760 hours. No specialty comments available. Problem  Neck Pain     OBJECTIVE:  VS:  HT:5\' 8"  (172.7 cm)   WT:155 lb (70.3 kg)  BMI:23.57    BP:114/66  HR:69bpm  TEMP: ( )  RESP:98 %  PHYSICAL EXAM: Constitutional: WDWN, Non-toxic appearing. Psychiatric: Alert & appropriately interactive. Not depressed or anxious appearing. Respiratory: No increased work of breathing. Trachea Midline Eyes: Pupils are equal. EOM intact without nystagmus. No scleral icterus Cardiovascular:  Peripheral Pulses: peripheral pulses symmetrical No clubbing or cyanosis appreciated Capillary Refill is normal, less than 2 seconds No signficant generalized edema/anasarca Sensory Exam: intact to light touch  SHOULDER Findings: No stiffness, no weakness, no crepitus noted  Neck:   Well aligned, no significant torticollis  Midline Bony TTP: none   Paraspinal Muscle Spasm: Yes, bilateral  Limited side bending and rotation, worse with rotation to the left and side bending to the right.  Shoulder on the left is elevated  NEURAL TENSION SIGNS Right Left  Brachial Plexus Squeeze: normal, no pain normal, no pain  Arm Squeeze Test: normal, no pain normal, no pain  Spurling's Compression Test: normal, no pain  normal, no pain  Lhermitte's Compression test: Negative, no radiating pain   MOTOR TESTING: Intact in all UE myotomes Otherwise per osteopathic manipulation procedure note and exam findings  ASSESSMENT & PLAN:   1. Increased frequency of headaches   2. Segmental and somatic dysfunction of cervical region   3. Segmental and somatic  dysfunction of thoracic region   4. Segmental and somatic dysfunction of rib cage   5. Neck pain    PLAN:    Neck pain Paraspinal muscle spasms with underlying likely mild degenerative change.  No significant radicular symptoms.  Osteopathic manipulation performed today and she responded well to this.  We will plan to follow-up with her in 4 weeks and see how she is progressing at that time.  In the interim we will have her begin working on therapeutic exercises as well as an inflatable home cervical traction device that is available over-the-counter.  Information was provided for this today.  If any lack of improvement can consider further diagnostic testing with plain film x-rays however at this time no red flag symptoms.   ++++++++++++++++++++++++++++++++++++++++++++ Orders & Meds: Orders Placed This Encounter  Procedures  . OSTEOPATHIC MANIPULATION TREATMENT  . Misc procedure    No orders of the defined types were placed in this encounter.   ++++++++++++++++++++++++++++++++++++++++++++ Follow-up: Return in about 4 weeks (around 11/02/2017).   Pertinent documentation may be included in additional procedure notes, imaging studies, problem based documentation and patient instructions. Please see these sections of the encounter for additional information regarding this visit. CMA/ATC served as Neurosurgeonscribe during this visit. History, Physical, and Plan performed by medical provider. Documentation and orders reviewed and attested to.      Andrena MewsMichael D Jamie Mies, DO     [facility]

## 2017-11-04 ENCOUNTER — Ambulatory Visit: Payer: BLUE CROSS/BLUE SHIELD | Admitting: Sports Medicine

## 2017-11-04 DIAGNOSIS — Z0289 Encounter for other administrative examinations: Secondary | ICD-10-CM

## 2017-11-15 ENCOUNTER — Encounter: Payer: BLUE CROSS/BLUE SHIELD | Admitting: Women's Health

## 2017-11-18 ENCOUNTER — Encounter: Payer: Self-pay | Admitting: Sports Medicine

## 2018-01-18 ENCOUNTER — Encounter: Payer: BLUE CROSS/BLUE SHIELD | Admitting: Women's Health

## 2018-03-06 ENCOUNTER — Encounter: Payer: BLUE CROSS/BLUE SHIELD | Admitting: Women's Health

## 2018-05-02 ENCOUNTER — Encounter: Payer: Self-pay | Admitting: Women's Health

## 2018-05-02 ENCOUNTER — Ambulatory Visit (INDEPENDENT_AMBULATORY_CARE_PROVIDER_SITE_OTHER): Payer: BLUE CROSS/BLUE SHIELD | Admitting: Women's Health

## 2018-05-02 VITALS — BP 108/76 | Ht 67.75 in | Wt 152.0 lb

## 2018-05-02 DIAGNOSIS — Z01419 Encounter for gynecological examination (general) (routine) without abnormal findings: Secondary | ICD-10-CM

## 2018-05-02 NOTE — Patient Instructions (Signed)
Colonoscopy  [facility]  [phone]  Dr Carlean Purl  Health Maintenance, Female Adopting a healthy lifestyle and getting preventive care can go a long way to promote health and wellness. Talk with your health care provider about what schedule of regular examinations is right for you. This is a good chance for you to check in with your provider about disease prevention and staying healthy. In between checkups, there are plenty of things you can do on your own. Experts have done a lot of research about which lifestyle changes and preventive measures are most likely to keep you healthy. Ask your health care provider for more information. Weight and diet Eat a healthy diet  Be sure to include plenty of vegetables, fruits, low-fat dairy products, and lean protein.  Do not eat a lot of foods high in solid fats, added sugars, or salt.  Get regular exercise. This is one of the most important things you can do for your health. ? Most adults should exercise for at least 150 minutes each week. The exercise should increase your heart rate and make you sweat (moderate-intensity exercise). ? Most adults should also do strengthening exercises at least twice a week. This is in addition to the moderate-intensity exercise.  Maintain a healthy weight  Body mass index (BMI) is a measurement that can be used to identify possible weight problems. It estimates body fat based on height and weight. Your health care provider can help determine your BMI and help you achieve or maintain a healthy weight.  For females 53 years of age and older: ? A BMI below 18.5 is considered underweight. ? A BMI of 18.5 to 24.9 is normal. ? A BMI of 25 to 29.9 is considered overweight. ? A BMI of 30 and above is considered obese.  Watch levels of cholesterol and blood lipids  You should start having your blood tested for lipids and cholesterol at 50 years of age, then have this test every 5 years.  You may need to have your  cholesterol levels checked more often if: ? Your lipid or cholesterol levels are high. ? You are older than 50 years of age. ? You are at high risk for heart disease.  Cancer screening Lung Cancer  Lung cancer screening is recommended for adults 109-105 years old who are at high risk for lung cancer because of a history of smoking.  A yearly low-dose CT scan of the lungs is recommended for people who: ? Currently smoke. ? Have quit within the past 15 years. ? Have at least a 30-pack-year history of smoking. A pack year is smoking an average of one pack of cigarettes a day for 1 year.  Yearly screening should continue until it has been 15 years since you quit.  Yearly screening should stop if you develop a health problem that would prevent you from having lung cancer treatment.  Breast Cancer  Practice breast self-awareness. This means understanding how your breasts normally appear and feel.  It also means doing regular breast self-exams. Let your health care provider know about any changes, no matter how small.  If you are in your 20s or 30s, you should have a clinical breast exam (CBE) by a health care provider every 1-3 years as part of a regular health exam.  If you are 42 or older, have a CBE every year. Also consider having a breast X-ray (mammogram) every year.  If you have a family history of breast cancer, talk to your health care provider  about genetic screening.  If you are at high risk for breast cancer, talk to your health care provider about having an MRI and a mammogram every year.  Breast cancer gene (BRCA) assessment is recommended for women who have family members with BRCA-related cancers. BRCA-related cancers include: ? Breast. ? Ovarian. ? Tubal. ? Peritoneal cancers.  Results of the assessment will determine the need for genetic counseling and BRCA1 and BRCA2 testing.  Cervical Cancer Your health care provider may recommend that you be screened regularly  for cancer of the pelvic organs (ovaries, uterus, and vagina). This screening involves a pelvic examination, including checking for microscopic changes to the surface of your cervix (Pap test). You may be encouraged to have this screening done every 3 years, beginning at age 19.  For women ages 50-65, health care providers may recommend pelvic exams and Pap testing every 3 years, or they may recommend the Pap and pelvic exam, combined with testing for human papilloma virus (HPV), every 5 years. Some types of HPV increase your risk of cervical cancer. Testing for HPV may also be done on women of any age with unclear Pap test results.  Other health care providers may not recommend any screening for nonpregnant women who are considered low risk for pelvic cancer and who do not have symptoms. Ask your health care provider if a screening pelvic exam is right for you.  If you have had past treatment for cervical cancer or a condition that could lead to cancer, you need Pap tests and screening for cancer for at least 20 years after your treatment. If Pap tests have been discontinued, your risk factors (such as having a new sexual partner) need to be reassessed to determine if screening should resume. Some women have medical problems that increase the chance of getting cervical cancer. In these cases, your health care provider may recommend more frequent screening and Pap tests.  Colorectal Cancer  This type of cancer can be detected and often prevented.  Routine colorectal cancer screening usually begins at 50 years of age and continues through 50 years of age.  Your health care provider may recommend screening at an earlier age if you have risk factors for colon cancer.  Your health care provider may also recommend using home test kits to check for hidden blood in the stool.  A small camera at the end of a tube can be used to examine your colon directly (sigmoidoscopy or colonoscopy). This is done to  check for the earliest forms of colorectal cancer.  Routine screening usually begins at age 28.  Direct examination of the colon should be repeated every 5-10 years through 50 years of age. However, you may need to be screened more often if early forms of precancerous polyps or small growths are found.  Skin Cancer  Check your skin from head to toe regularly.  Tell your health care provider about any new moles or changes in moles, especially if there is a change in a mole's shape or color.  Also tell your health care provider if you have a mole that is larger than the size of a pencil eraser.  Always use sunscreen. Apply sunscreen liberally and repeatedly throughout the day.  Protect yourself by wearing long sleeves, pants, a wide-brimmed hat, and sunglasses whenever you are outside.  Heart disease, diabetes, and high blood pressure  High blood pressure causes heart disease and increases the risk of stroke. High blood pressure is more likely to develop in: ?  People who have blood pressure in the high end of the normal range (130-139/85-89 mm Hg). ? People who are overweight or obese. ? People who are African American.  If you are 18-39 years of age, have your blood pressure checked every 3-5 years. If you are 40 years of age or older, have your blood pressure checked every year. You should have your blood pressure measured twice-once when you are at a hospital or clinic, and once when you are not at a hospital or clinic. Record the average of the two measurements. To check your blood pressure when you are not at a hospital or clinic, you can use: ? An automated blood pressure machine at a pharmacy. ? A home blood pressure monitor.  If you are between 55 years and 79 years old, ask your health care provider if you should take aspirin to prevent strokes.  Have regular diabetes screenings. This involves taking a blood sample to check your fasting blood sugar level. ? If you are at a  normal weight and have a low risk for diabetes, have this test once every three years after 50 years of age. ? If you are overweight and have a high risk for diabetes, consider being tested at a younger age or more often. Preventing infection Hepatitis B  If you have a higher risk for hepatitis B, you should be screened for this virus. You are considered at high risk for hepatitis B if: ? You were born in a country where hepatitis B is common. Ask your health care provider which countries are considered high risk. ? Your parents were born in a high-risk country, and you have not been immunized against hepatitis B (hepatitis B vaccine). ? You have HIV or AIDS. ? You use needles to inject street drugs. ? You live with someone who has hepatitis B. ? You have had sex with someone who has hepatitis B. ? You get hemodialysis treatment. ? You take certain medicines for conditions, including cancer, organ transplantation, and autoimmune conditions.  Hepatitis C  Blood testing is recommended for: ? Everyone born from 1945 through 1965. ? Anyone with known risk factors for hepatitis C.  Sexually transmitted infections (STIs)  You should be screened for sexually transmitted infections (STIs) including gonorrhea and chlamydia if: ? You are sexually active and are younger than 50 years of age. ? You are older than 50 years of age and your health care provider tells you that you are at risk for this type of infection. ? Your sexual activity has changed since you were last screened and you are at an increased risk for chlamydia or gonorrhea. Ask your health care provider if you are at risk.  If you do not have HIV, but are at risk, it may be recommended that you take a prescription medicine daily to prevent HIV infection. This is called pre-exposure prophylaxis (PrEP). You are considered at risk if: ? You are sexually active and do not regularly use condoms or know the HIV status of your  partner(s). ? You take drugs by injection. ? You are sexually active with a partner who has HIV.  Talk with your health care provider about whether you are at high risk of being infected with HIV. If you choose to begin PrEP, you should first be tested for HIV. You should then be tested every 3 months for as long as you are taking PrEP. Pregnancy  If you are premenopausal and you may become pregnant, ask your health   care provider about preconception counseling.  If you may become pregnant, take 400 to 800 micrograms (mcg) of folic acid every day.  If you want to prevent pregnancy, talk to your health care provider about birth control (contraception). Osteoporosis and menopause  Osteoporosis is a disease in which the bones lose minerals and strength with aging. This can result in serious bone fractures. Your risk for osteoporosis can be identified using a bone density scan.  If you are 65 years of age or older, or if you are at risk for osteoporosis and fractures, ask your health care provider if you should be screened.  Ask your health care provider whether you should take a calcium or vitamin D supplement to lower your risk for osteoporosis.  Menopause may have certain physical symptoms and risks.  Hormone replacement therapy may reduce some of these symptoms and risks. Talk to your health care provider about whether hormone replacement therapy is right for you. Follow these instructions at home:  Schedule regular health, dental, and eye exams.  Stay current with your immunizations.  Do not use any tobacco products including cigarettes, chewing tobacco, or electronic cigarettes.  If you are pregnant, do not drink alcohol.  If you are breastfeeding, limit how much and how often you drink alcohol.  Limit alcohol intake to no more than 1 drink per day for nonpregnant women. One drink equals 12 ounces of beer, 5 ounces of wine, or 1 ounces of hard liquor.  Do not use street  drugs.  Do not share needles.  Ask your health care provider for help if you need support or information about quitting drugs.  Tell your health care provider if you often feel depressed.  Tell your health care provider if you have ever been abused or do not feel safe at home. This information is not intended to replace advice given to you by your health care provider. Make sure you discuss any questions you have with your health care provider. Document Released: 04/19/2011 Document Revised: 03/11/2016 Document Reviewed: 07/08/2015 Elsevier Interactive Patient Education  2018 Elsevier Inc.  

## 2018-05-02 NOTE — Progress Notes (Signed)
Jamie Hale 05/18/1966 [id number]    History:    Presents for annual exam.  Cycles every 2 to 3 months/condoms.  Normal Pap and mammogram history.  History of benign breast biopsy.  History of migraines, less frequent.  Past medical history, past surgical history, family history and social history were all reviewed and documented in the EPIC chart.  Works for [employer].  Daughter 5024, son 2320 both in college both doing well.  [place]s elderly living in [place].  ROS:  A ROS was performed and pertinent positives and negatives are included.  Exam:  Vitals:   05/02/18 1213  Weight: 152 lb (68.9 kg)  Height: 5' 7.75" (1.721 m)   Body mass index is 23.28 kg/m.   General appearance:  Normal Thyroid:  Symmetrical, normal in size, without palpable masses or nodularity. Respiratory  Auscultation:  Clear without wheezing or rhonchi Cardiovascular  Auscultation:  Regular rate, without rubs, murmurs or gallops  Edema/varicosities:  Not grossly evident Abdominal  Soft,nontender, without masses, guarding or rebound.  Liver/spleen:  No organomegaly noted  Hernia:  None appreciated  Skin  Inspection:  Grossly normal   Breasts: Examined lying and sitting.     Right: Without masses, retractions, discharge or axillary adenopathy.     Left: Without masses, retractions, discharge or axillary adenopathy. Gentitourinary   Inguinal/mons:  Normal without inguinal adenopathy  External genitalia:  Normal  BUS/Urethra/Skene's glands:  Normal  Vagina:  Normal  Cervix:  Normal  Uterus:   normal in size, shape and contour.  Midline and mobile  Adnexa/parametria:     Rt: Without masses or tenderness.   Lt: Without masses or tenderness.  Anus and perineum: Normal  Digital rectal exam: Normal sphincter tone without palpated masses or tenderness  Assessment/Plan:  52 y.o. MWF G2, P2 for annual exam with no complaints.  Perimenopausal/irregular cycles/condoms Migraines decreasing  frequency Labs-health screening at work reports as normal  Plan: Menopause reviewed, will keep a menstrual record, minimal symptoms.  SBE's, overdue for mammogram instructed to schedule, calcium rich foods, vitamin D 1000 daily encouraged.  Encouraged to continue healthy lifestyle of regular exercise, healthy diet.  Pap normal 2018, new screening guidelines reviewed.  Instructed to have health screening labs faxed to our office.   Harrington Challengerancy J Vy [facility]WHNP, 12:16 PM 05/02/2018

## 2018-08-13 ENCOUNTER — Other Ambulatory Visit: Payer: Self-pay | Admitting: Women's Health

## 2018-08-13 DIAGNOSIS — G43001 Migraine without aura, not intractable, with status migrainosus: Secondary | ICD-10-CM

## 2018-09-06 ENCOUNTER — Telehealth: Payer: Self-pay | Admitting: *Deleted

## 2018-09-06 ENCOUNTER — Other Ambulatory Visit: Payer: Self-pay | Admitting: Women's Health

## 2018-09-06 MED ORDER — ESTRADIOL 0.05 MG/24HR TD PTTW: 1.0000 | MEDICATED_PATCH | TRANSDERMAL | 2 refills | Status: DC

## 2018-09-06 MED ORDER — PROGESTERONE MICRONIZED 200 MG PO CAPS: ORAL_CAPSULE | ORAL | 2 refills | Status: DC

## 2018-09-06 NOTE — Telephone Encounter (Signed)
TC to review symptoms, cycles stopped, hot flashes, no sleep, doesn't feel well.  Changing clothes throughout the day at work.  HRT reviewed, prometrium 200 mg po day 1 through 12, and Vivelle patch 0.05 twice weekly.  Directed to call if no's relief of symptoms.

## 2018-09-06 NOTE — Telephone Encounter (Signed)
Patient called asking if birth control pill could be sent in from [facility] on  05/02/2018, patient said it was discussed but she declined at the time. Would like to start now to help with Perimenopause as discussed. Please advise

## 2018-10-16 ENCOUNTER — Other Ambulatory Visit: Payer: Self-pay | Admitting: Gynecology

## 2018-10-16 ENCOUNTER — Ambulatory Visit
Admission: RE | Admit: 2018-10-16 | Discharge: 2018-10-16 | Disposition: A | Discharge status: Home / Self Care | Payer: BLUE CROSS/BLUE SHIELD | Source: Ambulatory Visit | Attending: Gynecology | Admitting: Gynecology

## 2018-10-16 DIAGNOSIS — Z1231 Encounter for screening mammogram for malignant neoplasm of breast: Secondary | ICD-10-CM

## 2018-10-17 ENCOUNTER — Other Ambulatory Visit: Payer: Self-pay | Admitting: Gynecology

## 2018-10-17 DIAGNOSIS — R928 Other abnormal and inconclusive findings on diagnostic imaging of breast: Secondary | ICD-10-CM

## 2018-10-24 ENCOUNTER — Ambulatory Visit
Admission: RE | Admit: 2018-10-24 | Discharge: 2018-10-24 | Disposition: A | Discharge status: Home / Self Care | Payer: BLUE CROSS/BLUE SHIELD | Source: Ambulatory Visit | Attending: Gynecology | Admitting: Gynecology

## 2018-10-24 DIAGNOSIS — R928 Other abnormal and inconclusive findings on diagnostic imaging of breast: Secondary | ICD-10-CM

## 2018-10-24 DIAGNOSIS — N6012 Diffuse cystic mastopathy of left breast: Secondary | ICD-10-CM | POA: Diagnosis not present

## 2018-10-24 DIAGNOSIS — R922 Inconclusive mammogram: Secondary | ICD-10-CM | POA: Diagnosis not present

## 2019-05-07 ENCOUNTER — Other Ambulatory Visit: Payer: Self-pay

## 2019-05-08 ENCOUNTER — Ambulatory Visit (INDEPENDENT_AMBULATORY_CARE_PROVIDER_SITE_OTHER): Payer: BC Managed Care – PPO | Admitting: Women's Health

## 2019-05-08 ENCOUNTER — Encounter: Payer: Self-pay | Admitting: Women's Health

## 2019-05-08 VITALS — BP 110/78 | Ht 67.0 in | Wt 154.0 lb

## 2019-05-08 DIAGNOSIS — Z13220 Encounter for screening for lipoid disorders: Secondary | ICD-10-CM

## 2019-05-08 DIAGNOSIS — Z01419 Encounter for gynecological examination (general) (routine) without abnormal findings: Secondary | ICD-10-CM

## 2019-05-08 MED ORDER — ESTRADIOL 0.05 MG/24HR TD PTTW: 1.0000 | MEDICATED_PATCH | TRANSDERMAL | 4 refills | Status: DC

## 2019-05-08 MED ORDER — PROGESTERONE MICRONIZED 100 MG PO CAPS: 100.0000 mg | ORAL_CAPSULE | Freq: Every day | ORAL | 4 refills | Status: DC

## 2019-05-08 NOTE — Patient Instructions (Addendum)
[facility]   Dr Carlean Purl  [phone]  Health Maintenance for Postmenopausal Women Menopause is a normal process in which your ability to get pregnant comes to an end. This process happens slowly over many months or years, usually between the ages of 34 and 50. Menopause is complete when you have missed your menstrual periods for 12 months. It is important to talk with your health care provider about some of the most common conditions that affect women after menopause (postmenopausal women). These include heart disease, cancer, and bone loss (osteoporosis). Adopting a healthy lifestyle and getting preventive care can help to promote your health and wellness. The actions you take can also lower your chances of developing some of these common conditions. What should I know about menopause? During menopause, you may get a number of symptoms, such as:  Hot flashes. These can be moderate or severe.  Night sweats.  Decrease in sex drive.  Mood swings.  Headaches.  Tiredness.  Irritability.  Memory problems.  Insomnia. Choosing to treat or not to treat these symptoms is a decision that you make with your health care provider. Do I need hormone replacement therapy?  Hormone replacement therapy is effective in treating symptoms that are caused by menopause, such as hot flashes and night sweats.  Hormone replacement carries certain risks, especially as you become older. If you are thinking about using estrogen or estrogen with progestin, discuss the benefits and risks with your health care provider. What is my risk for heart disease and stroke? The risk of heart disease, heart attack, and stroke increases as you age. One of the causes may be a change in the body's hormones during menopause. This can affect how your body uses dietary fats, triglycerides, and cholesterol. Heart attack and stroke are medical emergencies. There are many things that you can do to help prevent heart disease and stroke.  Watch your blood pressure  High blood pressure causes heart disease and increases the risk of stroke. This is more likely to develop in people who have high blood pressure readings, are of African descent, or are overweight.  Have your blood pressure checked: ? Every 3-5 years if you are 23-19 years of age. ? Every year if you are 71 years old or older. Eat a healthy diet   Eat a diet that includes plenty of vegetables, fruits, low-fat dairy products, and lean protein.  Do not eat a lot of foods that are high in solid fats, added sugars, or sodium. Get regular exercise Get regular exercise. This is one of the most important things you can do for your health. Most adults should:  Try to exercise for at least 150 minutes each week. The exercise should increase your heart rate and make you sweat (moderate-intensity exercise).  Try to do strengthening exercises at least twice each week. Do these in addition to the moderate-intensity exercise.  Spend less time sitting. Even light physical activity can be beneficial. Other tips  Work with your health care provider to achieve or maintain a healthy weight.  Do not use any products that contain nicotine or tobacco, such as cigarettes, e-cigarettes, and chewing tobacco. If you need help quitting, ask your health care provider.  Know your numbers. Ask your health care provider to check your cholesterol and your blood sugar (glucose). Continue to have your blood tested as directed by your health care provider. Do I need screening for cancer? Depending on your health history and family history, you may need to have  cancer screening at different stages of your life. This may include screening for:  Breast cancer.  Cervical cancer.  Lung cancer.  Colorectal cancer. What is my risk for osteoporosis? After menopause, you may be at increased risk for osteoporosis. Osteoporosis is a condition in which bone destruction happens more quickly than  new bone creation. To help prevent osteoporosis or the bone fractures that can happen because of osteoporosis, you may take the following actions:  If you are 2419-51 years old, get at least 1,000 mg of calcium and at least 600 mg of vitamin D per day.  If you are older than age 51 but younger than age 51, get at least 1,200 mg of calcium and at least 600 mg of vitamin D per day.  If you are older than age 51, get at least 1,200 mg of calcium and at least 800 mg of vitamin D per day. Smoking and drinking excessive alcohol increase the risk of osteoporosis. Eat foods that are rich in calcium and vitamin D, and do weight-bearing exercises several times each week as directed by your health care provider. How does menopause affect my mental health? Depression may occur at any age, but it is more common as you become older. Common symptoms of depression include:  Low or sad mood.  Changes in sleep patterns.  Changes in appetite or eating patterns.  Feeling an overall lack of motivation or enjoyment of activities that you previously enjoyed.  Frequent crying spells. Talk with your health care provider if you think that you are experiencing depression. General instructions See your health care provider for regular wellness exams and vaccines. This may include:  Scheduling regular health, dental, and eye exams.  Getting and maintaining your vaccines. These include: ? Influenza vaccine. Get this vaccine each year before the flu season begins. ? Pneumonia vaccine. ? Shingles vaccine. ? Tetanus, diphtheria, and pertussis (Tdap) booster vaccine. Your health care provider may also recommend other immunizations. Tell your health care provider if you have ever been abused or do not feel safe at home. Summary  Menopause is a normal process in which your ability to get pregnant comes to an end.  This condition causes hot flashes, night sweats, decreased interest in sex, mood swings, headaches, or  lack of sleep.  Treatment for this condition may include hormone replacement therapy.  Take actions to keep yourself healthy, including exercising regularly, eating a healthy diet, watching your weight, and checking your blood pressure and blood sugar levels.  Get screened for cancer and depression. Make sure that you are up to date with all your vaccines. This information is not intended to replace advice given to you by your health care provider. Make sure you discuss any questions you have with your health care provider. Document Released: 11/26/2005 Document Revised: 09/27/2018 Document Reviewed: 09/27/2018 Elsevier Patient Education  2020 [employer]

## 2019-05-08 NOTE — Progress Notes (Signed)
Jamie Hale 11/12/1965 [id number]    History:    Presents for annual exam.  Postmenopausal on cyclic HRT with bleeding like a cycle 7 months, spotting some months or no bleeding at all.  States while on the progesterone feels more groggy/fatigued.  Normal Pap and mammogram history.  Migraine history none since starting HRT.  History of a benign breast biopsy, mammogram January 2020 was normal after ultrasound.  Has not had a screening colonoscopy.  Past medical history, past surgical history, family history and social history were all reviewed and documented in the EPIC chart.  Working from home for [employer].  Son 38, daughter 1 getting married next year.  Parents live in [place].  ROS:  A ROS was performed and pertinent positives and negatives are included.  Exam:  Vitals:   05/08/19 1211  BP: 110/78  Weight: 154 lb (69.9 kg)  Height: 5\' 7"  (1.702 m)   Body mass index is 24.12 kg/m.   General appearance:  Normal Thyroid:  Symmetrical, normal in size, without palpable masses or nodularity. Respiratory  Auscultation:  Clear without wheezing or rhonchi Cardiovascular  Auscultation:  Regular rate, without rubs, murmurs or gallops  Edema/varicosities:  Not grossly evident Abdominal  Soft,nontender, without masses, guarding or rebound.  Liver/spleen:  No organomegaly noted  Hernia:  None appreciated  Skin  Inspection:  Grossly normal   Breasts: Examined lying and sitting.     Right: Without masses, retractions, discharge or axillary adenopathy.     Left: Without masses, retractions, discharge or axillary adenopathy. Gentitourinary   Inguinal/mons:  Normal without inguinal adenopathy  External genitalia:  Normal  BUS/Urethra/Skene's glands:  Normal  Vagina:  Normal  Cervix:  Normal  Uterus:   normal in size, shape and contour.  Midline and mobile  Adnexa/parametria:     Rt: Without masses or tenderness.   Lt: Without masses or tenderness.  Anus and perineum: Normal  Digital  rectal exam: Normal sphincter tone without palpated masses or tenderness  Assessment/Plan:  53 y.o. MWF G2 P2 for annual exam with no complaints.  Postmenopausal on cyclic HRT with good relief of menopausal symptoms but irregular bleeding History of migraines none in the past year  Plan: HRT reviewed, slight risk for blood clots, strokes and breast cancer, will try Prometrium 100 mg p.o. daily at bedtime, continue Vivelle 0.05.twice weekly.  Instructed to call if continued grogginess or irregular bleeding.  SBEs, continue annual screening mammogram, calcium rich foods, vitamin D 2000 daily encouraged.   screening colonoscopy reviewed, [facility] information given instructed to schedule.  CBC, CMP, lipid panel, Pap normal 2018, new screening guidelines reviewed. Huel Cote [facility], 1:08 PM 05/08/2019

## 2019-05-09 LAB — CBC WITH DIFFERENTIAL/PLATELET
Absolute Monocytes: 368 cells/uL (ref 200–950)
Basophils Absolute: 18 cells/uL (ref 0–200)
Basophils Relative: 0.4 %
Eosinophils Absolute: 129 cells/uL (ref 15–500)
Eosinophils Relative: 2.8 %
HCT: 41.6 % (ref 35.0–45.0)
Hemoglobin: 14.4 g/dL (ref 11.7–15.5)
Lymphs Abs: 1587 cells/uL (ref 850–3900)
MCH: 31.5 pg (ref 27.0–33.0)
MCHC: 34.6 g/dL (ref 32.0–36.0)
MCV: 91 fL (ref 80.0–100.0)
MPV: 10.1 fL (ref 7.5–12.5)
Monocytes Relative: 8 %
Neutro Abs: 2498 cells/uL (ref 1500–7800)
Neutrophils Relative %: 54.3 %
Platelets: 210 10*3/uL (ref 140–400)
RBC: 4.57 10*6/uL (ref 3.80–5.10)
RDW: 11.7 % (ref 11.0–15.0)
Total Lymphocyte: 34.5 %
WBC: 4.6 10*3/uL (ref 3.8–10.8)

## 2019-05-09 LAB — COMPREHENSIVE METABOLIC PANEL
AG Ratio: 1.9 (calc) (ref 1.0–2.5)
ALT: 18 U/L (ref 6–29)
AST: 20 U/L (ref 10–35)
Albumin: 4.5 g/dL (ref 3.6–5.1)
Alkaline phosphatase (APISO): 53 U/L (ref 37–153)
BUN: 14 mg/dL (ref 7–25)
CO2: 26 mmol/L (ref 20–32)
Calcium: 9.5 mg/dL (ref 8.6–10.4)
Chloride: 100 mmol/L (ref 98–110)
Creat: 0.82 mg/dL (ref 0.50–1.05)
Globulin: 2.4 g/dL (calc) (ref 1.9–3.7)
Glucose, Bld: 88 mg/dL (ref 65–99)
Potassium: 4 mmol/L (ref 3.5–5.3)
Sodium: 138 mmol/L (ref 135–146)
Total Bilirubin: 0.6 mg/dL (ref 0.2–1.2)
Total Protein: 6.9 g/dL (ref 6.1–8.1)

## 2019-05-09 LAB — LIPID PANEL
Cholesterol: 156 mg/dL (ref <200)
HDL: 60 mg/dL (ref >=50)
LDL Cholesterol (Calc): 80 mg/dL (calc)
Non-HDL Cholesterol (Calc): 96 mg/dL (calc) (ref <130)
Total CHOL/HDL Ratio: 2.6 (calc) (ref <5.0)
Triglycerides: 77 mg/dL (ref <150)

## 2019-05-23 ENCOUNTER — Other Ambulatory Visit: Payer: Self-pay

## 2019-05-23 NOTE — Telephone Encounter (Signed)
Pharmacy sent request for refill for 200 mg progestone. I called and spoke with pharmacy and had them delete that Rx as she is not on 100 mg nightly. They did have that new dose Rx and she had just picked it up 05/18/19. They will cancel the old Rx.

## 2019-06-01 ENCOUNTER — Telehealth: Payer: Self-pay | Admitting: *Deleted

## 2019-06-01 NOTE — Telephone Encounter (Signed)
Ok she had fatigue and grogginess with prometrium 200 mg so lets try provera 10 mg day 1-12 of each month, start  today.  Have her call if bleeding does not stop.

## 2019-06-01 NOTE — Telephone Encounter (Signed)
Patient was switched to progesterone 100 mg daily at [facility] on 05/08/19. Patient said after taking medication she felt bloated, she called today c./o bleeding x 10day, wore pad, changed twice daily, so not heavy, but was still daily she stopped progesterone on 05/30/19. Patient said before stopping the progesterone the bleeding became heavier, this is why she stopped and decided to call. Reports having this issues before with the progesterone 200 mg dose day 1-12 Please advise

## 2019-06-04 MED ORDER — MEDROXYPROGESTERONE ACETATE 10 MG PO TABS: ORAL_TABLET | ORAL | 3 refills | Status: DC

## 2019-06-04 NOTE — Telephone Encounter (Signed)
Patient informed, Rx sent.  

## 2019-07-11 ENCOUNTER — Encounter: Payer: Self-pay | Admitting: Gynecology

## 2019-12-04 ENCOUNTER — Other Ambulatory Visit: Payer: Self-pay | Admitting: Women's Health

## 2019-12-04 DIAGNOSIS — Z1231 Encounter for screening mammogram for malignant neoplasm of breast: Secondary | ICD-10-CM

## 2019-12-07 ENCOUNTER — Other Ambulatory Visit: Payer: Self-pay

## 2019-12-07 ENCOUNTER — Ambulatory Visit
Admission: RE | Admit: 2019-12-07 | Discharge: 2019-12-07 | Disposition: A | Discharge status: Home / Self Care | Payer: 59 | Source: Ambulatory Visit | Attending: Women's Health | Admitting: Women's Health

## 2019-12-07 DIAGNOSIS — Z1231 Encounter for screening mammogram for malignant neoplasm of breast: Secondary | ICD-10-CM

## 2020-05-13 ENCOUNTER — Ambulatory Visit (INDEPENDENT_AMBULATORY_CARE_PROVIDER_SITE_OTHER): Payer: 59 | Admitting: Nurse Practitioner

## 2020-05-13 ENCOUNTER — Encounter: Payer: Self-pay | Admitting: Nurse Practitioner

## 2020-05-13 ENCOUNTER — Other Ambulatory Visit: Payer: Self-pay

## 2020-05-13 VITALS — BP 116/70 | Ht 68.5 in | Wt 150.0 lb

## 2020-05-13 DIAGNOSIS — Z13220 Encounter for screening for lipoid disorders: Secondary | ICD-10-CM

## 2020-05-13 DIAGNOSIS — Z01419 Encounter for gynecological examination (general) (routine) without abnormal findings: Secondary | ICD-10-CM

## 2020-05-13 DIAGNOSIS — N951 Menopausal and female climacteric states: Secondary | ICD-10-CM

## 2020-05-13 DIAGNOSIS — Z79890 Hormone replacement therapy (postmenopausal): Secondary | ICD-10-CM

## 2020-05-13 MED ORDER — ESTRADIOL 0.05 MG/24HR TD PTTW: 1.0000 | MEDICATED_PATCH | TRANSDERMAL | 4 refills | Status: DC

## 2020-05-13 MED ORDER — PROGESTERONE MICRONIZED 100 MG PO CAPS: 100.0000 mg | ORAL_CAPSULE | Freq: Every day | ORAL | 3 refills | Status: DC

## 2020-05-13 NOTE — Patient Instructions (Addendum)
[facility] °[phone] °[address] [place], [place] [address] ° °Health Maintenance, Female °Adopting a healthy lifestyle and getting preventive care are important in promoting health and wellness. Ask your health care provider about: °· The right schedule for you to have regular tests and exams. °· Things you can do on your own to prevent diseases and keep yourself healthy. °What should I know about diet, weight, and exercise? °Eat a healthy diet ° °· Eat a diet that includes plenty of vegetables, fruits, low-fat dairy products, and lean protein. °· Do not eat a lot of foods that are high in solid fats, added sugars, or sodium. °Maintain a healthy weight °Body mass index (BMI) is used to identify weight problems. It estimates body fat based on height and weight. Your health care provider can help determine your BMI and help you achieve or maintain a healthy weight. °Get regular exercise °Get regular exercise. This is one of the most important things you can do for your health. Most adults should: °· Exercise for at least 150 minutes each week. The exercise should increase your heart rate and make you sweat (moderate-intensity exercise). °· Do strengthening exercises at least twice a week. This is in addition to the moderate-intensity exercise. °· Spend less time sitting. Even light physical activity can be beneficial. °Watch cholesterol and blood lipids °Have your blood tested for lipids and cholesterol at 52 years of age, then have this test every 5 years. °Have your cholesterol levels checked more often if: °· Your lipid or cholesterol levels are high. °· You are older than 52 years of age. °· You are at high risk for heart disease. °What should I know about cancer screening? °Depending on your health history and family history, you may need to have cancer screening at various ages. This may include screening for: °· Breast cancer. °· Cervical cancer. °· Colorectal cancer. °· Skin cancer. °· Lung  cancer. °What should I know about heart disease, diabetes, and high blood pressure? °Blood pressure and heart disease °· High blood pressure causes heart disease and increases the risk of stroke. This is more likely to develop in people who have high blood pressure readings, are of African descent, or are overweight. °· Have your blood pressure checked: °? Every 3-5 years if you are 18-39 years of age. °? Every year if you are 40 years old or older. °Diabetes °Have regular diabetes screenings. This checks your fasting blood sugar level. Have the screening done: °· Once every three years after age 40 if you are at a normal weight and have a low risk for diabetes. °· More often and at a younger age if you are overweight or have a high risk for diabetes. °What should I know about preventing infection? °Hepatitis B °If you have a higher risk for hepatitis B, you should be screened for this virus. Talk with your health care provider to find out if you are at risk for hepatitis B infection. °Hepatitis C °Testing is recommended for: °· Everyone born from 1945 through 1965. °· Anyone with known risk factors for hepatitis C. °Sexually transmitted infections (STIs) °· Get screened for STIs, including gonorrhea and chlamydia, if: °? You are sexually active and are younger than 52 years of age. °? You are older than 52 years of age and your health care provider tells you that you are at risk for this type of infection. °? Your sexual activity has changed since you were last screened, and you are at   increased risk for chlamydia or gonorrhea. Ask your health care provider if you are at risk. °· Ask your health care provider about whether you are at high risk for HIV. Your health care provider may recommend a prescription medicine to help prevent HIV infection. If you choose to take medicine to prevent HIV, you should first get tested for HIV. You should then be tested every 3 months for as long as you are taking the  medicine. °Pregnancy °· If you are about to stop having your period (premenopausal) and you may become pregnant, seek counseling before you get pregnant. °· Take 400 to 800 micrograms (mcg) of folic acid every day if you become pregnant. °· Ask for birth control (contraception) if you want to prevent pregnancy. °Osteoporosis and menopause °Osteoporosis is a disease in which the bones lose minerals and strength with aging. This can result in bone fractures. If you are 65 years old or older, or if you are at risk for osteoporosis and fractures, ask your health care provider if you should: °· Be screened for bone loss. °· Take a calcium or vitamin D supplement to lower your risk of fractures. °· Be given hormone replacement therapy (HRT) to treat symptoms of menopause. °Follow these instructions at home: °Lifestyle °· Do not use any products that contain nicotine or tobacco, such as cigarettes, e-cigarettes, and chewing tobacco. If you need help quitting, ask your health care provider. °· Do not use street drugs. °· Do not share needles. °· Ask your health care provider for help if you need support or information about quitting drugs. °Alcohol use °· Do not drink alcohol if: °? Your health care provider tells you not to drink. °? You are pregnant, may be pregnant, or are planning to become pregnant. °· If you drink alcohol: °? Limit how much you use to 0-1 drink a day. °? Limit intake if you are breastfeeding. °· Be aware of how much alcohol is in your drink. In the [place] one drink equals one 12 oz bottle of beer (355 mL), one 5 oz glass of wine (148 mL), or one 1½ oz glass of hard liquor (44 mL). °General instructions °· Schedule regular health, dental, and eye exams. °· Stay current with your vaccines. °· Tell your health care provider if: °? You often feel depressed. °? You have ever been abused or do not feel safe at home. °Summary °· Adopting a healthy lifestyle and getting preventive care are important in  promoting health and wellness. °· Follow your health care provider's instructions about healthy diet, exercising, and getting tested or screened for diseases. °· Follow your health care provider's instructions on monitoring your cholesterol and blood pressure. °This information is not intended to replace advice given to you by your health care provider. Make sure you discuss any questions you have with your health care provider. °Document Revised: 09/27/2018 Document Reviewed: 09/27/2018 °Elsevier Patient Education © 2020 Elsevier Inc. ° °

## 2020-05-13 NOTE — Progress Notes (Signed)
   Jamie Hale 1966-03-12 [id number]   History:  54 y.o. G2P2002 presents for annual exam. Postmenopasual on cyclic HRT with Provera 10 mg cycle days 1-12 and Vivelle patch 0.05 mg twice weekly. She complains of irregular bleeding that varies from light to heavy and different days of the month. FSH 2018 was 8.1. Normal pap and mammogram history. Migraines have been under control since starting HRT.   Gynecologic History Patient's last menstrual period was 04/17/2020.   Contraception: post menopausal status Last Pap: 11/11/2016. Results were: normal Last mammogram: 12/10/2019. Results were: normal Last colonoscopy: never   Past medical history, past surgical history, family history and social history were all reviewed and documented in the EPIC chart.  ROS:  A ROS was performed and pertinent positives and negatives are included.  Exam:  Vitals:   05/13/20 0853  Weight: 150 lb (68 kg)  Height: 5' 8.5" (1.74 m)   Body mass index is 22.48 kg/m.  General appearance:  Normal Thyroid:  Symmetrical, normal in size, without palpable masses or nodularity. Respiratory  Auscultation:  Clear without wheezing or rhonchi Cardiovascular  Auscultation:  Regular rate, without rubs, murmurs or gallops  Edema/varicosities:  Not grossly evident Abdominal  Soft,nontender, without masses, guarding or rebound.  Liver/spleen:  No organomegaly noted  Hernia:  None appreciated  Skin  Inspection:  Grossly normal   Breasts: Examined lying and sitting.   Right: Without masses, retractions, discharge or axillary adenopathy.   Left: Without masses, retractions, discharge or axillary adenopathy. Gentitourinary   Inguinal/mons:  Normal without inguinal adenopathy  External genitalia:  Normal  BUS/Urethra/Skene's glands:  Normal  Vagina:  Normal  Cervix:  Normal  Uterus:  Anteverted, normal in size, shape and contour.  Midline and mobile  Adnexa/parametria:     Rt: Without masses or  tenderness.   Lt: Without masses or tenderness.  Anus and perineum: Normal  Digital rectal exam: Normal sphincter tone without palpated masses or tenderness  Assessment/Plan:  54 y.o. G for annual exam.   Well female exam with routine gynecological exam - Plan: CBC with Differential/Platelet, Comprehensive metabolic panel. Education provided on SBEs, importance of preventative screenings, current guidelines, high calcium diet, regular exercise, and multivitamin daily.   Menopausal symptoms - Plan: Follicle stimulating hormone  Lipid screening - Plan: Lipid panel  Hormone replacement therapy - Plan: progesterone (PROMETRIUM) 100 MG capsule. We will switch to Prometrium 100 mg continuously. She tried 200 mg in the past and it made her drowsy so she was switched to Provera. She is aware to take at night and will let me know if she is able to tolerate. Continue Vivelle 0.05 mg patch twice weekly.   Screening for colon cancer - has not had colonoscopy. Information provided on [facility] and discussed current guidelines and importance of preventative screenings.   Follow up in 1 year for annual         Olivia Mackie [facility], 8:55 AM 05/13/2020

## 2020-05-14 LAB — COMPREHENSIVE METABOLIC PANEL
AG Ratio: 2 (calc) (ref 1.0–2.5)
ALT: 24 U/L (ref 6–29)
AST: 21 U/L (ref 10–35)
Albumin: 4.5 g/dL (ref 3.6–5.1)
Alkaline phosphatase (APISO): 61 U/L (ref 37–153)
BUN: 13 mg/dL (ref 7–25)
CO2: 27 mmol/L (ref 20–32)
Calcium: 8.9 mg/dL (ref 8.6–10.4)
Chloride: 100 mmol/L (ref 98–110)
Creat: 0.8 mg/dL (ref 0.50–1.05)
Globulin: 2.3 g/dL (calc) (ref 1.9–3.7)
Glucose, Bld: 82 mg/dL (ref 65–99)
Potassium: 3.8 mmol/L (ref 3.5–5.3)
Sodium: 138 mmol/L (ref 135–146)
Total Bilirubin: 0.6 mg/dL (ref 0.2–1.2)
Total Protein: 6.8 g/dL (ref 6.1–8.1)

## 2020-05-14 LAB — CBC WITH DIFFERENTIAL/PLATELET
Absolute Monocytes: 442 cells/uL (ref 200–950)
Basophils Absolute: 22 cells/uL (ref 0–200)
Basophils Relative: 0.4 %
Eosinophils Absolute: 101 cells/uL (ref 15–500)
Eosinophils Relative: 1.8 %
HCT: 42.7 % (ref 35.0–45.0)
Hemoglobin: 14.4 g/dL (ref 11.7–15.5)
Lymphs Abs: 1070 cells/uL (ref 850–3900)
MCH: 31.4 pg (ref 27.0–33.0)
MCHC: 33.7 g/dL (ref 32.0–36.0)
MCV: 93.2 fL (ref 80.0–100.0)
MPV: 10 fL (ref 7.5–12.5)
Monocytes Relative: 7.9 %
Neutro Abs: 3965 cells/uL (ref 1500–7800)
Neutrophils Relative %: 70.8 %
Platelets: 179 10*3/uL (ref 140–400)
RBC: 4.58 10*6/uL (ref 3.80–5.10)
RDW: 11.6 % (ref 11.0–15.0)
Total Lymphocyte: 19.1 %
WBC: 5.6 10*3/uL (ref 3.8–10.8)

## 2020-05-14 LAB — LIPID PANEL
Cholesterol: 176 mg/dL (ref <200)
HDL: 59 mg/dL (ref >=50)
LDL Cholesterol (Calc): 98 mg/dL (calc)
Non-HDL Cholesterol (Calc): 117 mg/dL (calc) (ref <130)
Total CHOL/HDL Ratio: 3 (calc) (ref <5.0)
Triglycerides: 99 mg/dL (ref <150)

## 2020-05-14 LAB — FOLLICLE STIMULATING HORMONE: FSH: 42.1 m[IU]/mL

## 2020-08-07 ENCOUNTER — Ambulatory Visit: Payer: 59 | Attending: Internal Medicine

## 2020-08-07 DIAGNOSIS — Z23 Encounter for immunization: Secondary | ICD-10-CM

## 2020-08-07 NOTE — Progress Notes (Signed)
   Covid-19 Vaccination Clinic  Name:  Jamie Hale    MRN: [id number] DOB: [birth year]  08/07/2020  Jamie Hale was observed post Covid-19 immunization for 15 minutes without incident. She was provided with Vaccine Information Sheet and instruction to access the V-Safe system.   Jamie Hale was instructed to call 911 with any severe reactions post vaccine: Marland Kitchen Difficulty breathing  . Swelling of face and throat  . A fast heartbeat  . A bad rash all over body  . Dizziness and weakness   Immunizations Administered    Name Date Dose VIS Date Route   Pfizer COVID-19 Vaccine 08/07/2020  3:40 PM 0.3 mL 12/12/2018 Intramuscular   Manufacturer: [employer], [employer]   Lot: [id number]   NDC: [address]-0

## 2020-08-09 ENCOUNTER — Other Ambulatory Visit: Payer: Self-pay | Admitting: Nurse Practitioner

## 2020-08-09 DIAGNOSIS — Z79890 Hormone replacement therapy (postmenopausal): Secondary | ICD-10-CM

## 2020-08-11 NOTE — Telephone Encounter (Signed)
Annual exam was on 727/21

## 2020-08-28 ENCOUNTER — Ambulatory Visit: Payer: 59 | Attending: Internal Medicine

## 2020-08-28 DIAGNOSIS — Z23 Encounter for immunization: Secondary | ICD-10-CM

## 2020-08-28 NOTE — Progress Notes (Signed)
° °  Covid-19 Vaccination Clinic  Name:  [name]    MRN: [id number] DOB: [birth year]  08/28/2020  [name] was observed post Covid-19 immunization for 15 minutes without incident. She was provided with Vaccine Information Sheet and instruction to access the V-Safe system.   [name] was instructed to call 911 with any severe reactions post vaccine:  Difficulty breathing   Swelling of face and throat   A fast heartbeat   A bad rash all over body   Dizziness and weakness   Immunizations Administered    Name Date Dose VIS Date Route   Pfizer COVID-19 Vaccine 08/28/2020  3:32 PM 0.3 mL 08/06/2020 Intramuscular   Manufacturer: [employer], [employer]   Lot: [id number]   NDC: [address]-4

## 2020-09-16 ENCOUNTER — Other Ambulatory Visit: Payer: Self-pay | Admitting: Nurse Practitioner

## 2020-11-11 ENCOUNTER — Other Ambulatory Visit: Payer: Self-pay | Admitting: Nurse Practitioner

## 2020-11-11 DIAGNOSIS — Z1231 Encounter for screening mammogram for malignant neoplasm of breast: Secondary | ICD-10-CM

## 2020-12-24 ENCOUNTER — Other Ambulatory Visit: Payer: Self-pay

## 2020-12-24 ENCOUNTER — Ambulatory Visit
Admission: RE | Admit: 2020-12-24 | Discharge: 2020-12-24 | Disposition: A | Discharge status: Home / Self Care | Payer: 59 | Source: Ambulatory Visit | Attending: Nurse Practitioner | Admitting: Nurse Practitioner

## 2020-12-24 DIAGNOSIS — Z1231 Encounter for screening mammogram for malignant neoplasm of breast: Secondary | ICD-10-CM

## 2020-12-29 ENCOUNTER — Other Ambulatory Visit: Payer: Self-pay | Admitting: Nurse Practitioner

## 2020-12-29 DIAGNOSIS — R928 Other abnormal and inconclusive findings on diagnostic imaging of breast: Secondary | ICD-10-CM

## 2021-01-13 ENCOUNTER — Ambulatory Visit
Admission: RE | Admit: 2021-01-13 | Discharge: 2021-01-13 | Disposition: A | Discharge status: Home / Self Care | Payer: 59 | Source: Ambulatory Visit | Attending: Nurse Practitioner | Admitting: Nurse Practitioner

## 2021-01-13 ENCOUNTER — Other Ambulatory Visit: Payer: Self-pay | Admitting: Nurse Practitioner

## 2021-01-13 ENCOUNTER — Other Ambulatory Visit: Payer: Self-pay

## 2021-01-13 DIAGNOSIS — N6320 Unspecified lump in the left breast, unspecified quadrant: Secondary | ICD-10-CM

## 2021-01-13 DIAGNOSIS — R928 Other abnormal and inconclusive findings on diagnostic imaging of breast: Secondary | ICD-10-CM

## 2021-05-14 ENCOUNTER — Ambulatory Visit: Payer: 59 | Admitting: Nurse Practitioner

## 2021-05-29 ENCOUNTER — Other Ambulatory Visit: Payer: Self-pay | Admitting: Nurse Practitioner

## 2021-05-29 NOTE — Telephone Encounter (Signed)
Annual exam scheduled on 07/06/21 

## 2021-07-06 ENCOUNTER — Other Ambulatory Visit (HOSPITAL_COMMUNITY)
Admission: RE | Admit: 2021-07-06 | Discharge: 2021-07-06 | Disposition: A | Discharge status: Home / Self Care | Payer: 59 | Source: Ambulatory Visit | Attending: Nurse Practitioner | Admitting: Nurse Practitioner

## 2021-07-06 ENCOUNTER — Encounter: Payer: Self-pay | Admitting: Nurse Practitioner

## 2021-07-06 ENCOUNTER — Ambulatory Visit (INDEPENDENT_AMBULATORY_CARE_PROVIDER_SITE_OTHER): Payer: 59 | Admitting: Nurse Practitioner

## 2021-07-06 ENCOUNTER — Other Ambulatory Visit: Payer: Self-pay

## 2021-07-06 VITALS — BP 116/70 | Ht 67.0 in | Wt 159.0 lb

## 2021-07-06 DIAGNOSIS — N950 Postmenopausal bleeding: Secondary | ICD-10-CM | POA: Diagnosis not present

## 2021-07-06 DIAGNOSIS — Z01419 Encounter for gynecological examination (general) (routine) without abnormal findings: Secondary | ICD-10-CM

## 2021-07-06 DIAGNOSIS — Z79890 Hormone replacement therapy (postmenopausal): Secondary | ICD-10-CM

## 2021-07-06 MED ORDER — ESTRADIOL 0.075 MG/24HR TD PTTW: 1.0000 | MEDICATED_PATCH | TRANSDERMAL | 3 refills | Status: DC

## 2021-07-06 MED ORDER — PROGESTERONE MICRONIZED 100 MG PO CAPS: ORAL_CAPSULE | ORAL | 3 refills | Status: DC

## 2021-07-06 NOTE — Progress Notes (Signed)
[facility]ie [facility] [birth year] [id number]   History:  55 y.o. G2P2002 presents for annual exam. Postmenopasual on HRT. She complains of light spotting 1-2 weeks out of the month. Sometimes spotting occurs with intercourse. She also has worsening hot flashes closer to time of patch exchange. Normal pap history. Migraines have been under control since starting HRT.   Gynecologic History Patient's last menstrual period was 04/17/2020.   Contraception: post menopausal status Sexually active: Yes  Health Maintenance Last Pap: 11/11/2016. Results were: Normal, 5-year repeat Last mammogram: 12/24/2020. Results were: left breast calcifications. Ultrasound 01/13/2021 - Benign left breast calcifications, predominantly milk of calcium, associated with cysts Last colonoscopy: Never Last Dexa: Not indicated  Past medical history, past surgical history, family history and social history were all reviewed and documented in the EPIC chart. Married. 3 yo son, 38 yo old daughter. Works at [employer].   ROS:  A ROS was performed and pertinent positives and negatives are included.  Exam:  Vitals:   07/06/21 1455  BP: 116/70  Weight: 159 lb (72.1 kg)  Height: 5\' 7"  (1.702 m)    Body mass index is 24.9 kg/m.  General appearance:  Normal Thyroid:  Symmetrical, normal in size, without palpable masses or nodularity. Respiratory  Auscultation:  Clear without wheezing or rhonchi Cardiovascular  Auscultation:  Regular rate, without rubs, murmurs or gallops  Edema/varicosities:  Not grossly evident Abdominal  Soft,nontender, without masses, guarding or rebound.  Liver/spleen:  No organomegaly noted  Hernia:  None appreciated  Skin  Inspection:  Grossly normal   Breasts: Examined lying and sitting.   Right: Without masses, retractions, discharge or axillary adenopathy.   Left: Without masses, retractions, discharge or axillary adenopathy. Gentitourinary   Inguinal/mons:  Normal without inguinal  adenopathy  External genitalia:  Normal  BUS/Urethra/Skene's glands:  Normal  Vagina:  Normal  Cervix:  Normal  Uterus:  Normal in size, shape and contour.  Midline and mobile  Adnexa/parametria:     Rt: Without masses or tenderness.   Lt: Without masses or tenderness.  Anus and perineum: Normal  Digital rectal exam: Normal sphincter tone without palpated masses or tenderness  Patient informed chaperone available to be present for breast and pelvic exam. Patient has requested no chaperone to be present. Patient has been advised what will be completed during breast and pelvic exam.   Assessment/Plan:  55 y.o. G for annual exam.   Well female exam with routine gynecological exam - Plan: Cytology - PAP( [employer] [facility]), CBC with Differential/Platelet, Comprehensive metabolic panel, TSH. Education provided on SBEs, importance of preventative screenings, current guidelines, high calcium diet, regular exercise, and multivitamin daily.   Hormone replacement therapy (HRT) - Plan: estradiol (VIVELLE-DOT) 0.075 MG/24HR, progesterone (PROMETRIUM) 100 MG capsule. She complains of worsening hot flashes when she gets closer to time to exchange patch. Hot flashes are daily and very bothersome. She is aware that HRT may be causing her spotting. She will wait to start higher dose until after pelvic ultrasound if appropriate.   Postmenopausal bleeding - Plan: 40 PELVIS TRANSVAGINAL NON-OB (TV ONLY). She complains of light spotting 1-2 weeks out of the month. Sometimes spotting occurs with intercourse. On HRT.   Screening for cervical cancer - Normal Pap history.  Pap with HR HPV today.   Screening for breast cancer - Continue annual screenings.  Normal breast exam today.  Screening for colon cancer - has not had colonoscopy. Information provided on [facility] and discussed current guidelines and importance of preventative screenings.  Follow up in 1 year for annual.      [facility], 3:48 PM  07/06/2021

## 2021-07-08 LAB — CBC WITH DIFFERENTIAL/PLATELET
Absolute Monocytes: 433 cells/uL (ref 200–950)
Basophils Absolute: 18 cells/uL (ref 0–200)
Basophils Relative: 0.3 %
Eosinophils Absolute: 122 cells/uL (ref 15–500)
Eosinophils Relative: 2 %
HCT: 42.5 % (ref 35.0–45.0)
Hemoglobin: 14.1 g/dL (ref 11.7–15.5)
Lymphs Abs: 1549 cells/uL (ref 850–3900)
MCH: 31.4 pg (ref 27.0–33.0)
MCHC: 33.2 g/dL (ref 32.0–36.0)
MCV: 94.7 fL (ref 80.0–100.0)
MPV: 9.8 fL (ref 7.5–12.5)
Monocytes Relative: 7.1 %
Neutro Abs: 3977 cells/uL (ref 1500–7800)
Neutrophils Relative %: 65.2 %
Platelets: 194 10*3/uL (ref 140–400)
RBC: 4.49 10*6/uL (ref 3.80–5.10)
RDW: 11.9 % (ref 11.0–15.0)
Total Lymphocyte: 25.4 %
WBC: 6.1 10*3/uL (ref 3.8–10.8)

## 2021-07-08 LAB — COMPREHENSIVE METABOLIC PANEL
AG Ratio: 1.7 (calc) (ref 1.0–2.5)
ALT: 22 U/L (ref 6–29)
AST: 21 U/L (ref 10–35)
Albumin: 4.3 g/dL (ref 3.6–5.1)
Alkaline phosphatase (APISO): 55 U/L (ref 37–153)
BUN: 14 mg/dL (ref 7–25)
CO2: 27 mmol/L (ref 20–32)
Calcium: 9.5 mg/dL (ref 8.6–10.4)
Chloride: 100 mmol/L (ref 98–110)
Creat: 0.82 mg/dL (ref 0.50–1.03)
Globulin: 2.5 g/dL (calc) (ref 1.9–3.7)
Glucose, Bld: 91 mg/dL (ref 65–99)
Potassium: 3.9 mmol/L (ref 3.5–5.3)
Sodium: 139 mmol/L (ref 135–146)
Total Bilirubin: 0.4 mg/dL (ref 0.2–1.2)
Total Protein: 6.8 g/dL (ref 6.1–8.1)

## 2021-07-08 LAB — TSH: TSH: 1.06 mIU/L

## 2021-07-09 LAB — CYTOLOGY - PAP
Comment: NEGATIVE
Diagnosis: NEGATIVE
Diagnosis: REACTIVE
High risk HPV: NEGATIVE

## 2021-07-23 ENCOUNTER — Encounter: Payer: Self-pay | Admitting: Obstetrics & Gynecology

## 2021-07-23 ENCOUNTER — Ambulatory Visit (INDEPENDENT_AMBULATORY_CARE_PROVIDER_SITE_OTHER): Payer: 59

## 2021-07-23 ENCOUNTER — Ambulatory Visit (INDEPENDENT_AMBULATORY_CARE_PROVIDER_SITE_OTHER): Payer: 59 | Admitting: Obstetrics & Gynecology

## 2021-07-23 ENCOUNTER — Other Ambulatory Visit: Payer: Self-pay

## 2021-07-23 VITALS — BP 110/76

## 2021-07-23 DIAGNOSIS — N950 Postmenopausal bleeding: Secondary | ICD-10-CM | POA: Diagnosis not present

## 2021-07-23 DIAGNOSIS — Z79890 Hormone replacement therapy (postmenopausal): Secondary | ICD-10-CM

## 2021-07-23 MED ORDER — ESTRADIOL 0.1 MG/24HR TD PTTW: 1.0000 | MEDICATED_PATCH | TRANSDERMAL | 4 refills | Status: DC

## 2021-07-23 NOTE — Progress Notes (Signed)
    Jamie Hale [birth year] [id number]        55 y.o.  G2P2002   RP: PMB on HRT for Pelvic US  HPI: Postmenopasual on HRT. She complains of light spotting 1-2 weeks out of the month. Sometimes spotting occurs with intercourse. She also has worsening hot flashes closer to time of patch exchange.  Migraines controled on HRT.   OB History  Gravida Para Term Preterm AB Living  2 2 2     2   SAB IAB Ectopic Multiple Live Births               # Outcome Date GA Lbr Len/2nd Weight Sex Delivery Anes PTL Lv  2 Term           1 Term             Past medical history,surgical history, problem list, medications, allergies, family history and social history were all reviewed and documented in the EPIC chart.   Directed ROS with pertinent positives and negatives documented in the history of present illness/assessment and plan.  Exam:  Vitals:   07/23/21 0854  BP: 110/76   General appearance:  Normal  Pelvic 09/22/21 today: T/V images.  Anteverted enlarged uterus with a single intramural fibroid to the right side measured at 6 x 5 cm.  The overall uterine size is measured at 10.47 x 8.33 x 7.37 cm.  The endometrial lining is thin and symmetrical measured at 3.9 mm with no mass or thickening seen.  The cavity is diverted to the left side by the fibroid.  Left ovary is small with atrophic appearance.  Right ovary with a simple follicle measured at 2.1 x 1.7 cm.  No adnexal mass.  No free fluid in the pelvis.   Assessment/Plan:  55 y.o. G2P2002   1. Postmenopausal bleeding Mild postmenopausal bleeding on hormone replacement therapy.  Patient reassured that the endometrial lining is thin and normal measured at 3.9 mm.  The pelvic ultrasound reveals an intramural fibroid measured at 6 x 5 cm.  Ovaries are normal.  Decision to restart on the hormone replacement therapy.  2. Postmenopausal hormone replacement therapy Patient desires continuing on hormone replacement therapy.  No contraindication.  No  evidence of endometrial pathology on pelvic ultrasound today.  Estradiol 0.1 patch twice a week with Prometrium 100 mg capsule at bedtime prescribed.  Other orders - estradiol (VIVELLE-DOT) 0.1 MG/24HR patch; Place 1 patch (0.1 mg total) onto the skin 2 (two) times a week.  - Prometrium 100 mg HS (prescription sent 06/2021)  07/2021 MD, 9:07 AM 07/23/2021

## 2021-07-26 ENCOUNTER — Encounter: Payer: Self-pay | Admitting: Obstetrics & Gynecology

## 2021-07-27 ENCOUNTER — Telehealth: Payer: Self-pay

## 2021-07-27 NOTE — Telephone Encounter (Signed)
Patient said she has been "researching" since her pelvic u/s.  She said her estradiol is elevated.  She wonders "if the fibroid might be causing the bleeding and if so should I really be increasing the estrogen?"

## 2021-07-28 NOTE — Telephone Encounter (Signed)
  Genia Del, MD  You 3 minutes ago (12:04 PM)   With HRT you cannot win on all fronts, you have risks and benefits.  I am more than happy for her to reduce her Estradiol dosage to any lower dose she would like or even to stop and continue only on the Prometrium 100 mg HS

## 2021-07-29 NOTE — Telephone Encounter (Signed)
Spoke with patient and informed her. °

## 2021-08-25 ENCOUNTER — Other Ambulatory Visit: Payer: Self-pay | Admitting: Nurse Practitioner

## 2021-09-18 ENCOUNTER — Ambulatory Visit: Payer: 59

## 2021-09-18 ENCOUNTER — Encounter: Payer: Self-pay | Admitting: Emergency Medicine

## 2021-09-18 ENCOUNTER — Ambulatory Visit
Admission: EM | Admit: 2021-09-18 | Discharge: 2021-09-18 | Disposition: A | Discharge status: Home / Self Care | Payer: 59 | Attending: Family Medicine | Admitting: Family Medicine

## 2021-09-18 ENCOUNTER — Other Ambulatory Visit: Payer: Self-pay

## 2021-09-18 DIAGNOSIS — J069 Acute upper respiratory infection, unspecified: Secondary | ICD-10-CM | POA: Diagnosis not present

## 2021-09-18 DIAGNOSIS — N390 Urinary tract infection, site not specified: Secondary | ICD-10-CM | POA: Diagnosis not present

## 2021-09-18 DIAGNOSIS — R509 Fever, unspecified: Secondary | ICD-10-CM | POA: Diagnosis not present

## 2021-09-18 LAB — POCT URINALYSIS DIP (MANUAL ENTRY)
Bilirubin, UA: NEGATIVE
Glucose, UA: NEGATIVE mg/dL
Ketones, POC UA: NEGATIVE mg/dL
Nitrite, UA: POSITIVE — AB
Protein Ur, POC: 100 mg/dL — AB
Spec Grav, UA: 1.025 (ref 1.010–1.025)
Urobilinogen, UA: 0.2 E.U./dL
pH, UA: 5.5 (ref 5.0–8.0)

## 2021-09-18 LAB — POCT RAPID STREP A (OFFICE): Rapid Strep A Screen: NEGATIVE

## 2021-09-18 MED ORDER — LIDOCAINE VISCOUS HCL 2 % MT SOLN: 5.0000 mL | OROMUCOSAL | 0 refills | Status: DC | PRN

## 2021-09-18 MED ORDER — CEPHALEXIN 500 MG PO CAPS: 500.0000 mg | ORAL_CAPSULE | Freq: Two times a day (BID) | ORAL | 0 refills | Status: DC

## 2021-09-18 NOTE — ED Provider Notes (Signed)
[facility]    CSN: [id number] Arrival date & time: 09/18/21  1003      History   Chief Complaint No chief complaint on file.   HPI [facility] is a 55 y.o. female.   Presenting today with 5-day history of sore throat, coughing, nasal congestion, fever, chills, body aches, fatigue.  Started noticing white spots on the back of her throat yesterday and became concerned for a strep infection.  She states she did a virtual visit yesterday was told she had a viral infection and given Zyrtec.  This has not been helping.  She is also noticing now the past few days some dysuria, hematuria.  She does have a history of urinary tract infections and states this feels consistent.  Denies chest pain, shortness of breath, abdominal pain, nausea vomiting or diarrhea.  So far taking Tylenol with minimal relief of symptoms.   Past Medical History:  Diagnosis Date   Headache    Seizure [facility])    eclamptic    Patient Active Problem List   Diagnosis Date Noted   Neck pain 10/05/2017   Increased frequency of headaches 11/11/2016   UTI (urinary tract infection) 03/29/2013   Seizure ([place])     Past Surgical History:  Procedure Laterality Date   BREAST BIOPSY Right 01/30/2014   benign   BREAST SURGERY     Breast Bx benign   GALLBLADDER SURGERY  08/2005   KNEE ARTHROSCOPY  02/1987   bi-lat     OB History     Gravida  2   Para  2   Term  2   Preterm      AB      Living  2      SAB      IAB      Ectopic      Multiple      Live Births               Home Medications    Prior to Admission medications   Medication Sig Start Date End Date Taking? Authorizing Provider  cephALEXin (KEFLEX) 500 MG capsule Take 1 capsule (500 mg total) by mouth 2 (two) times daily. 09/18/21  Yes Volney American, PA-C  lidocaine (XYLOCAINE) 2 % solution Use as directed 5 mLs in the mouth or throat as needed for mouth pain. 09/18/21  Yes Volney American, PA-C   Ascorbic Acid (VITAMIN C) 100 MG tablet Take 100 mg by mouth daily.    [provider]  cholecalciferol (VITAMIN D) 1000 UNITS tablet Take 1,000 Units by mouth daily.    [provider]  estradiol (VIVELLE-DOT) 0.1 MG/24HR patch Place 1 patch (0.1 mg total) onto the skin 2 (two) times a week. 07/23/21   Princess Bruins, MD  fish oil-omega-3 fatty acids 1000 MG capsule Take 2 g by mouth daily.    [provider]  progesterone (PROMETRIUM) 100 MG capsule TAKE 1 CAPSULE BY MOUTH EVERY DAY 07/06/21   Marny Lowenstein A, NP  SUMAtriptan (IMITREX) 50 MG tablet TAKE 1 TAB BY MOUTH EVERY 2 HOURS AS NEEDED FOR MIGRAINE. MAY REPEAT IN 2 HOURS IF HEADACHE PERSISTS 08/14/18   Huel Cote, NP    Family History Family History  Problem Relation Age of Onset   Breast cancer Paternal Grandmother 21   Hypertension Mother    Osteoarthritis Mother    Hypertension Father    Heart disease Father        quad  bypass   Breast cancer Paternal Aunt 28    Social History Social History   Tobacco Use   Smoking status: Never   Smokeless tobacco: Never  Vaping Use   Vaping Use: Never used  Substance Use Topics   Alcohol use: No   Drug use: No     Allergies   Patient has no known allergies.   Review of Systems Review of Systems Per HPI  Physical Exam Triage Vital Signs ED Triage Vitals  Enc Vitals Group     BP 09/18/21 1109 102/70     Pulse Rate 09/18/21 1109 83     Resp 09/18/21 1109 18     Temp 09/18/21 1109 (!) 102.1 F (38.9 C)     Temp Source 09/18/21 1109 Oral     SpO2 09/18/21 1109 98 %     Weight --      Height --      Head Circumference --      Peak Flow --      Pain Score 09/18/21 1112 6     Pain Loc --      Pain Edu? --      Excl. in [place]? --    No data found.  Updated Vital Signs BP 102/70 (BP Location: Right Arm)   Pulse 83   Temp (!) 102.1 F (38.9 C) (Oral)   Resp 18   LMP 04/17/2020   SpO2 98%   Visual Acuity Right Eye Distance:    Left Eye Distance:   Bilateral Distance:    Right Eye Near:   Left Eye Near:    Bilateral Near:     Physical Exam Vitals and nursing note reviewed.  Constitutional:      Appearance: Normal appearance.  HENT:     Head: Atraumatic.     Right Ear: Tympanic membrane and external ear normal.     Left Ear: Tympanic membrane and external ear normal.     Nose: Rhinorrhea present.     Mouth/Throat:     Mouth: Mucous membranes are moist.     Pharynx: Posterior oropharyngeal erythema present.  Eyes:     Extraocular Movements: Extraocular movements intact.     Conjunctiva/sclera: Conjunctivae normal.  Cardiovascular:     Rate and Rhythm: Normal rate and regular rhythm.     Heart sounds: Normal heart sounds.  Pulmonary:     Effort: Pulmonary effort is normal.     Breath sounds: Normal breath sounds. No wheezing or rales.  Abdominal:     General: Bowel sounds are normal. There is no distension.     Palpations: Abdomen is soft.     Tenderness: There is no abdominal tenderness. There is no right CVA tenderness, left CVA tenderness or guarding.  Musculoskeletal:        General: Normal range of motion.     Cervical back: Normal range of motion and neck supple.  Skin:    General: Skin is warm and dry.  Neurological:     Mental Status: She is alert and oriented to person, place, and time.  Psychiatric:        Mood and Affect: Mood normal.        Thought Content: Thought content normal.   UC Treatments / Results  Labs (all labs ordered are listed, but only abnormal results are displayed) Labs Reviewed  POCT URINALYSIS DIP (MANUAL ENTRY) - Abnormal; Notable for the following components:      Result Value   Clarity, UA cloudy (*)  Blood, UA moderate (*)    Protein Ur, POC =100 (*)    Nitrite, UA Positive (*)    Leukocytes, UA Large (3+) (*)    All other components within normal limits  URINE CULTURE  COVID-19, FLU A+B NAA  CULTURE, GROUP A STREP [facility])  POCT RAPID STREP A  (OFFICE)   EKG  Radiology No results found.  Procedures Procedures (including critical care time)  Medications Ordered in UC Medications - No data to display  Initial Impression / Assessment and Plan / UC Course  I have reviewed the triage vital signs and the nursing notes.  Pertinent labs & imaging results that were available during my care of the patient were reviewed by me and considered in my medical decision making (see chart for details).     Febrile to 102 F in triage today, otherwise vital signs benign and within normal limits.  Urinalysis positive for urinary tract infection, urine culture pending and will treat with Keflex in the meantime.  Do also suspect underlying influenza, COVID and flu testing pending at this time.  Rapid strep negative, throat culture pending for further rule out.  Given duration of symptoms, will forego Tamiflu at this time proactively and treat symptoms with viscous lidocaine, over-the-counter cold and congestion medications and pain relievers.  Return for acutely worsening symptoms at any time.  Work note given.  Final Clinical Impressions(s) / UC Diagnoses   Final diagnoses:  Acute lower UTI  Fever, unspecified  Viral URI with cough   Discharge Instructions   None    ED Prescriptions     Medication Sig Dispense Auth. Provider   cephALEXin (KEFLEX) 500 MG capsule Take 1 capsule (500 mg total) by mouth 2 (two) times daily. 14 capsule Particia Nearing, PA-C   lidocaine (XYLOCAINE) 2 % solution Use as directed 5 mLs in the mouth or throat as needed for mouth pain. 100 mL Particia Nearing, [place]      PDMP not reviewed this encounter.   Particia Nearing, [place] 09/18/21 1214

## 2021-09-18 NOTE — ED Triage Notes (Signed)
Sore throat, coughing started on Monday.  Fever started on Tuesday.  Did a virtual visit yesterday and was diagnosed with pharyngitis.  Was told to start zyrtec.  Noticed white spots on back of throat yesterday.  Now has yellow nasal congestion.  Also burning on urination that started on Monday.  Noticed some blood on toilet paper when wiping.

## 2021-09-19 LAB — COVID-19, FLU A+B NAA
Influenza A, NAA: DETECTED — AB
Influenza B, NAA: NOT DETECTED
SARS-CoV-2, NAA: NOT DETECTED

## 2021-09-21 LAB — URINE CULTURE: Culture: >=100000 — AB

## 2021-10-18 HISTORY — PX: COLONOSCOPY: SHX174

## 2022-01-26 ENCOUNTER — Other Ambulatory Visit: Payer: Self-pay | Admitting: Nurse Practitioner

## 2022-01-26 DIAGNOSIS — Z1231 Encounter for screening mammogram for malignant neoplasm of breast: Secondary | ICD-10-CM

## 2022-02-03 ENCOUNTER — Ambulatory Visit
Admission: RE | Admit: 2022-02-03 | Discharge: 2022-02-03 | Disposition: A | Discharge status: Home / Self Care | Payer: 59 | Source: Ambulatory Visit | Attending: Nurse Practitioner | Admitting: Nurse Practitioner

## 2022-02-03 DIAGNOSIS — Z1231 Encounter for screening mammogram for malignant neoplasm of breast: Secondary | ICD-10-CM

## 2022-05-14 LAB — HM COLONOSCOPY

## 2022-07-08 ENCOUNTER — Ambulatory Visit: Payer: 59 | Admitting: Nurse Practitioner

## 2022-07-08 ENCOUNTER — Ambulatory Visit (INDEPENDENT_AMBULATORY_CARE_PROVIDER_SITE_OTHER): Payer: 59 | Admitting: Nurse Practitioner

## 2022-07-08 ENCOUNTER — Encounter: Payer: Self-pay | Admitting: Nurse Practitioner

## 2022-07-08 VITALS — BP 108/70 | HR 65 | Ht 68.5 in | Wt 162.0 lb

## 2022-07-08 DIAGNOSIS — Z780 Asymptomatic menopausal state: Secondary | ICD-10-CM

## 2022-07-08 DIAGNOSIS — N951 Menopausal and female climacteric states: Secondary | ICD-10-CM

## 2022-07-08 DIAGNOSIS — Z01419 Encounter for gynecological examination (general) (routine) without abnormal findings: Secondary | ICD-10-CM | POA: Diagnosis not present

## 2022-07-08 DIAGNOSIS — Z8262 Family history of osteoporosis: Secondary | ICD-10-CM | POA: Diagnosis not present

## 2022-07-08 MED ORDER — VEOZAH 45 MG PO TABS: 1.0000 | ORAL_TABLET | Freq: Every day | ORAL | 1 refills | Status: DC

## 2022-07-08 NOTE — Progress Notes (Signed)
[facility] [employer] 1966/01/29 [id number]   History:  56 y.o. G2P2002 presents for annual exam. Postmenopausal - stopped HRT due to bleeding. No bleeding since. She is having significant hot flashes and night sweats. PMB evaluation last year - U/S showed endometrial thickness of 3.9 mm, 6 x 5 fibroid. Reports today that she can feel fibroid on right side that can radiate to back. It is not painful, just a pressure. Normal pap history.   Gynecologic History Patient's last menstrual period was 04/17/2020.   Contraception: post menopausal status Sexually active: Yes  Health Maintenance Last Pap: 07/06/2021. Results were: Normal neg HR HPV Last mammogram: 02/03/2022. Results were:Normal Last colonoscopy: 04/2022, 5-year recall Last Dexa: Never  Past medical history, past surgical history, family history and social history were all reviewed and documented in the EPIC chart. Married. Works in Scientist, research (physical sciences) at [employer]. 61 yo son, married. 31 yo old daughter, married, speech therapist. Mother diagnosed with breast cancer this year in her 57s, doing well.   ROS:  A ROS was performed and pertinent positives and negatives are included.  Exam:  Vitals:   07/08/22 0831  BP: 108/70  Pulse: 65  SpO2: 95%  Weight: 162 lb (73.5 kg)  Height: 5' 8.5" (1.74 m)     Body mass index is 24.27 kg/m.  General appearance:  Normal Thyroid:  Symmetrical, normal in size, without palpable masses or nodularity. Respiratory  Auscultation:  Clear without wheezing or rhonchi Cardiovascular  Auscultation:  Regular rate, without rubs, murmurs or gallops  Edema/varicosities:  Not grossly evident Abdominal  Soft,nontender, without masses, guarding or rebound.  Liver/spleen:  No organomegaly noted  Hernia:  None appreciated  Skin  Inspection:  Grossly normal   Breasts: Examined lying and sitting.   Right: Without masses, retractions, discharge or axillary adenopathy.   Left: Without masses, retractions, discharge or  axillary adenopathy. Genitourinary   Inguinal/mons:  Normal without inguinal adenopathy  External genitalia:  Normal appearing vulva with no masses, tenderness, or lesions  BUS/Urethra/Skene's glands:  Normal  Vagina:  Normal appearing with normal color and discharge, no lesions  Cervix:  Normal appearing without discharge or lesions  Uterus:  Normal in size, shape and contour.  Midline and mobile, nontender  Adnexa/parametria:     Rt: Normal in size, without masses or tenderness.   Lt: Normal in size, without masses or tenderness.  Anus and perineum: Normal  Digital rectal exam: Normal sphincter tone without palpated masses or tenderness  Patient informed chaperone available to be present for breast and pelvic exam. Patient has requested no chaperone to be present. Patient has been advised what will be completed during breast and pelvic exam.   Assessment/Plan:  55 y.o. G for annual exam.   Well female exam with routine gynecological exam - Plan: CBC with Differential/Platelet, Comprehensive metabolic panel, Lipid panel, TSH. Education provided on SBEs, importance of preventative screenings, current guidelines, high calcium diet, regular exercise, and multivitamin daily.   Family history of postmenopausal osteoporosis - Plan: DG Bone Density. Mother with osteoporosis and multiple fractures. MGF with h/o osteoporosis.    Postmenopausal - Plan: DG Bone Density. Stopped HRT this year due to bleeding. No bleeding since.   Vasomotor symptoms due to menopause - Plan: Fezolinetant (VEOZAH) 45 MG TABS daily. Significant hot flashes and night sweats since stopping HRT. Dicussed new FDA approved medication for postmenopausal hot flashes and she would like to try.   Screening for cervical cancer - Normal Pap history.  Will repeat at  5-year interval per guidelines.   Screening for breast cancer - Up to date. Continue annual screenings.  Normal breast exam today.  Screening for colon cancer -  Colonoscopy this year. Will repeat at GI's recommended inteval.    Follow up in 1 year for annual.      Olivia Mackie [facility], 8:59 AM 07/08/2022

## 2022-07-09 LAB — COMPREHENSIVE METABOLIC PANEL
AG Ratio: 1.9 (calc) (ref 1.0–2.5)
ALT: 23 U/L (ref 6–29)
AST: 21 U/L (ref 10–35)
Albumin: 4.7 g/dL (ref 3.6–5.1)
Alkaline phosphatase (APISO): 68 U/L (ref 37–153)
BUN: 14 mg/dL (ref 7–25)
CO2: 28 mmol/L (ref 20–32)
Calcium: 9.6 mg/dL (ref 8.6–10.4)
Chloride: 100 mmol/L (ref 98–110)
Creat: 0.84 mg/dL (ref 0.50–1.03)
Globulin: 2.5 g/dL (calc) (ref 1.9–3.7)
Glucose, Bld: 85 mg/dL (ref 65–99)
Potassium: 3.9 mmol/L (ref 3.5–5.3)
Sodium: 139 mmol/L (ref 135–146)
Total Bilirubin: 0.8 mg/dL (ref 0.2–1.2)
Total Protein: 7.2 g/dL (ref 6.1–8.1)

## 2022-07-09 LAB — CBC WITH DIFFERENTIAL/PLATELET
Absolute Monocytes: 317 cells/uL (ref 200–950)
Basophils Absolute: 18 cells/uL (ref 0–200)
Basophils Relative: 0.5 %
Eosinophils Absolute: 137 cells/uL (ref 15–500)
Eosinophils Relative: 3.8 %
HCT: 42.1 % (ref 35.0–45.0)
Hemoglobin: 14.4 g/dL (ref 11.7–15.5)
Lymphs Abs: 1483 cells/uL (ref 850–3900)
MCH: 31.4 pg (ref 27.0–33.0)
MCHC: 34.2 g/dL (ref 32.0–36.0)
MCV: 91.7 fL (ref 80.0–100.0)
MPV: 10.5 fL (ref 7.5–12.5)
Monocytes Relative: 8.8 %
Neutro Abs: 1645 cells/uL (ref 1500–7800)
Neutrophils Relative %: 45.7 %
Platelets: 190 10*3/uL (ref 140–400)
RBC: 4.59 10*6/uL (ref 3.80–5.10)
RDW: 11.8 % (ref 11.0–15.0)
Total Lymphocyte: 41.2 %
WBC: 3.6 10*3/uL — ABNORMAL LOW (ref 3.8–10.8)

## 2022-07-09 LAB — LIPID PANEL
Cholesterol: 165 mg/dL (ref <200)
HDL: 63 mg/dL (ref >=50)
LDL Cholesterol (Calc): 88 mg/dL (calc)
Non-HDL Cholesterol (Calc): 102 mg/dL (calc) (ref <130)
Total CHOL/HDL Ratio: 2.6 (calc) (ref <5.0)
Triglycerides: 62 mg/dL (ref <150)

## 2022-07-09 LAB — TSH: TSH: 1.94 mIU/L

## 2022-07-12 ENCOUNTER — Other Ambulatory Visit: Payer: Self-pay | Admitting: Nurse Practitioner

## 2022-07-12 ENCOUNTER — Telehealth: Payer: Self-pay | Admitting: *Deleted

## 2022-07-12 DIAGNOSIS — D72819 Decreased white blood cell count, unspecified: Secondary | ICD-10-CM

## 2022-07-12 NOTE — Telephone Encounter (Signed)
Patient received my chart message about results. She is a little concerned about the decrease in WBC at 3.6 compared to last year. She asked if you know what could have caused the decrease and if she should have it rechecked? Please advise

## 2022-07-12 NOTE — Telephone Encounter (Signed)
It is just barely under the normal threshold. We can recheck in 4 weeks to make sure it normalizes.

## 2022-07-13 NOTE — Telephone Encounter (Signed)
Spoke with patient and informed her. Scheduled for lab appt in 4 weeks.

## 2022-08-10 ENCOUNTER — Other Ambulatory Visit: Payer: 59

## 2022-08-10 ENCOUNTER — Ambulatory Visit (INDEPENDENT_AMBULATORY_CARE_PROVIDER_SITE_OTHER): Payer: 59

## 2022-08-10 ENCOUNTER — Other Ambulatory Visit: Payer: Self-pay | Admitting: Nurse Practitioner

## 2022-08-10 DIAGNOSIS — Z8262 Family history of osteoporosis: Secondary | ICD-10-CM

## 2022-08-10 DIAGNOSIS — D72819 Decreased white blood cell count, unspecified: Secondary | ICD-10-CM

## 2022-08-10 DIAGNOSIS — M8588 Other specified disorders of bone density and structure, other site: Secondary | ICD-10-CM

## 2022-08-10 DIAGNOSIS — Z13820 Encounter for screening for osteoporosis: Secondary | ICD-10-CM | POA: Diagnosis not present

## 2022-08-10 DIAGNOSIS — Z780 Asymptomatic menopausal state: Secondary | ICD-10-CM | POA: Diagnosis not present

## 2022-08-10 LAB — CBC WITH DIFFERENTIAL/PLATELET
Absolute Monocytes: 373 cells/uL (ref 200–950)
Basophils Absolute: 18 cells/uL (ref 0–200)
Basophils Relative: 0.4 %
Eosinophils Absolute: 161 cells/uL (ref 15–500)
Eosinophils Relative: 3.5 %
HCT: 44.1 % (ref 35.0–45.0)
Hemoglobin: 14.9 g/dL (ref 11.7–15.5)
Lymphs Abs: 1739 cells/uL (ref 850–3900)
MCH: 30.9 pg (ref 27.0–33.0)
MCHC: 33.8 g/dL (ref 32.0–36.0)
MCV: 91.5 fL (ref 80.0–100.0)
MPV: 10.2 fL (ref 7.5–12.5)
Monocytes Relative: 8.1 %
Neutro Abs: 2309 cells/uL (ref 1500–7800)
Neutrophils Relative %: 50.2 %
Platelets: 198 10*3/uL (ref 140–400)
RBC: 4.82 10*6/uL (ref 3.80–5.10)
RDW: 11.6 % (ref 11.0–15.0)
Total Lymphocyte: 37.8 %
WBC: 4.6 10*3/uL (ref 3.8–10.8)

## 2023-01-10 ENCOUNTER — Other Ambulatory Visit: Payer: Self-pay | Admitting: Nurse Practitioner

## 2023-01-10 DIAGNOSIS — Z1231 Encounter for screening mammogram for malignant neoplasm of breast: Secondary | ICD-10-CM

## 2023-02-23 ENCOUNTER — Ambulatory Visit
Admission: RE | Admit: 2023-02-23 | Discharge: 2023-02-23 | Disposition: A | Discharge status: Home / Self Care | Payer: No Typology Code available for payment source | Source: Ambulatory Visit | Attending: Nurse Practitioner

## 2023-02-23 DIAGNOSIS — Z1231 Encounter for screening mammogram for malignant neoplasm of breast: Secondary | ICD-10-CM

## 2023-05-11 ENCOUNTER — Encounter: Payer: Self-pay | Admitting: Nurse Practitioner

## 2023-05-11 ENCOUNTER — Ambulatory Visit (INDEPENDENT_AMBULATORY_CARE_PROVIDER_SITE_OTHER): Payer: No Typology Code available for payment source | Admitting: Nurse Practitioner

## 2023-05-11 VITALS — BP 120/80 | HR 78 | Wt 161.0 lb

## 2023-05-11 DIAGNOSIS — D219 Benign neoplasm of connective and other soft tissue, unspecified: Secondary | ICD-10-CM

## 2023-05-11 DIAGNOSIS — R1031 Right lower quadrant pain: Secondary | ICD-10-CM | POA: Diagnosis not present

## 2023-05-11 NOTE — Progress Notes (Signed)
   Acute Office Visit  Subjective:    Patient ID: [facility], female    DOB: 19-Aug-1966, 57 y.o.   MRN: [id number]   HPI 57 y.o. presents today for right lower abdominal discomfort. Progressive over ~9 months. Feels more like a pressure but does have occasional sharp pains. Intermittent, notices it most when lying on side and at the end of the day. Reports abdominal bloating and feelings of early satiety. Denies diarrhea, constipation, nausea, vomiting. No changes in diet or bowel habits. She is concerned it is related to ovarian cyst and fibroid seen in 2022 on ultrasound.   Pelvic ultrasound 07/23/2021 - Anteverted enlarged uterus with a single intramural fibroid to the right side measured at 6 x 5 cm.  The overall uterine size is measured at 10.47 x 8.33 x 7.37 cm.  The endometrial lining is thin and symmetrical measured at 3.9 mm with no mass or thickening seen.  The cavity is diverted to the left side by the fibroid.  Left ovary is small with atrophic appearance.  Right ovary with a simple follicle measured at 2.1 x 1.7 cm.  No adnexal mass.  No free fluid in the pelvis.   Patient's last menstrual period was 04/17/2020.    Review of Systems  Constitutional: Negative.   Gastrointestinal:  Positive for abdominal distention (Bloating) and abdominal pain. Negative for constipation, diarrhea, nausea and vomiting.  Genitourinary:  Negative for vaginal bleeding and vaginal discharge.       Objective:    Physical Exam Constitutional:      Appearance: Normal appearance.  Abdominal:     Tenderness: There is no abdominal tenderness.  Genitourinary:    General: Normal vulva.     Vagina: Normal.     Cervix: Normal.     Uterus: Normal.      Adnexa: Right adnexa normal and left adnexa normal.     BP 120/80   Pulse 78   Wt 161 lb (73 kg)   LMP 04/17/2020   SpO2 100%   BMI 24.12 kg/m  Wt Readings from Last 3 Encounters:  05/11/23 161 lb (73 kg)  07/08/22 162 lb (73.5 kg)   07/06/21 159 lb (72.1 kg)        Patient informed chaperone available to be present for breast and/or pelvic exam. Patient has requested no chaperone to be present. Patient has been advised what will be completed during breast and pelvic exam.   Assessment & Plan:   Problem List Items Addressed This Visit   None Visit Diagnoses     Right lower quadrant abdominal pain    -  Primary   Relevant Orders   US PELVIC COMPLETE WITH TRANSVAGINAL   Fibroids       Relevant Orders   US PELVIC COMPLETE WITH TRANSVAGINAL      Plan: Will schedule ultrasound. If normal, GI referral recommended.      Olivia Mackie DNP, 11:30 AM 05/11/2023

## 2023-05-17 ENCOUNTER — Ambulatory Visit (HOSPITAL_COMMUNITY)
Admission: RE | Admit: 2023-05-17 | Discharge: 2023-05-17 | Disposition: A | Discharge status: Home / Self Care | Payer: No Typology Code available for payment source | Source: Ambulatory Visit | Attending: Nurse Practitioner | Admitting: Nurse Practitioner

## 2023-05-17 DIAGNOSIS — D219 Benign neoplasm of connective and other soft tissue, unspecified: Secondary | ICD-10-CM | POA: Diagnosis present

## 2023-05-17 DIAGNOSIS — R1031 Right lower quadrant pain: Secondary | ICD-10-CM | POA: Insufficient documentation

## 2023-05-19 ENCOUNTER — Telehealth: Payer: Self-pay | Admitting: *Deleted

## 2023-05-19 NOTE — Telephone Encounter (Signed)
Yes, she can schedule an consult with Dr. Karma Greaser if she is agreeable.

## 2023-05-19 NOTE — Telephone Encounter (Signed)
Spoke with patient, OV scheduled for 06/21/23 at 1430 with Dr. Karma Greaser.   Routing to provider for final review. Patient is agreeable to disposition. Will close encounter.

## 2023-05-19 NOTE — Telephone Encounter (Signed)
Spoke with patient.  Patient is f/u to PUS results dated 05/17/23.  Patient states after PUS she has felt bloated, inflamed, uncomfortable, unable to sleep, pressure, tenderness and bladder feels full a lot. States she has numbness feeling in hip to her back. States this occurs anytime something "irritates" the fibroid and typically takes 2-3 days to resolve. Denies fever/chills, urinary frequency or urgency.   Patient states fibroid is problematic and wants to know if it would be beneficial to see physician or next steps. Offered OV with Dr. Edward Jolly, patient declined at this time. Requesting Tiffany review and make recommendations. Requesting call back from Tiffany to further discuss if available. Advised I will forward to Tiffany to review and our office will f/u. Patient agreeable.   Routing to Tiffany to review.

## 2023-06-21 ENCOUNTER — Encounter: Payer: Self-pay | Admitting: Obstetrics and Gynecology

## 2023-06-21 ENCOUNTER — Ambulatory Visit: Payer: No Typology Code available for payment source | Admitting: Obstetrics and Gynecology

## 2023-06-21 VITALS — BP 118/76 | HR 72 | Resp 16

## 2023-06-21 DIAGNOSIS — Z719 Counseling, unspecified: Secondary | ICD-10-CM | POA: Diagnosis not present

## 2023-06-21 DIAGNOSIS — R102 Pelvic and perineal pain: Secondary | ICD-10-CM | POA: Diagnosis not present

## 2023-06-21 DIAGNOSIS — D259 Leiomyoma of uterus, unspecified: Secondary | ICD-10-CM

## 2023-06-21 NOTE — Progress Notes (Signed)
/  57 y.o. y.o. female here for surgical consultation for the RLH/BSO.  She has had known fibroid uterus and has been experiencing some concerning symptoms for a while.  She is not bleeding but is feeling pain and increasing pressure over the site of the fibroid. She has also noted bloating and pelvic fullness that is bothersome to her. Flarring pain with TV US lasted 3-4 days after Radiates to her back Pain after intercourse on the right as well.  Narrative & Impression  CLINICAL DATA:  Initial evaluation for right lower quadrant pain, history of fibroids.   EXAM: TRANSABDOMINAL AND TRANSVAGINAL ULTRASOUND OF PELVIS   TECHNIQUE: Both transabdominal and transvaginal ultrasound examinations of the pelvis were performed. Transabdominal technique was performed for global imaging of the pelvis including uterus, ovaries, adnexal regions, and pelvic cul-de-sac. It was necessary to proceed with endovaginal exam following the transabdominal exam to visualize the endometrium.   COMPARISON:  Prior study from 07/23/2021.   FINDINGS: Uterus   Measurements: 10.1 x 6.4 x 6.4 cm = volume: 214.3 mL. Uterus is anteverted. Large intramural fibroid centered at the uterine fundus measures 5.6 x 4.5 x 5.0 cm.   Endometrium   Thickness: 3.9 mm.  No focal abnormality visualized.   Right ovary   Not visualized.  No adnexal mass.   Left ovary   Not visualized.  No adnexal mass.   Other findings   No abnormal free fluid.   IMPRESSION: 1. 5.6 cm intramural fibroid centered at the uterine fundus. 2. Normal endometrium measuring up to 3.9 mm in thickness. 3. Nonvisualization of either ovary.  No adnexal mass or free fluid.     Electronically Signed   By: Rise Mu M.D.   On: 05/18/2023 18:16      Result History  US PELVIC COMPLETE WITH TRANSVAGINAL (Order #[id number]) on 05/18/2023 - Order Result History Report   Height Weight BMI (Calculated)   161 lb (73 kg)     Imaging 07/23/21  PACS Intelerad Image Link    Narrative & Impression  T/V images.  Anteverted enlarged uterus with a single intramural fibroid to the right side measured at 6 x 5 cm.  The overall uterine size is measured at 10.47 x 8.33 x 7.37 cm.  The endometrial lining is thin and symmetrical measured at 3.9 mm with no mass or thickening seen.  The cavity is diverted to the left side by the fibroid.  Left ovary is small with atrophic appearance.  Right ovary with a simple follicle measured at 2.1 x 1.7 cm.  No adnexal mass.  No free fluid in the pelvis.      Blood pressure 118/76, pulse 72, resp. rate 16, last menstrual period 04/17/2020.     Component Value Date/Time   DIAGPAP  07/06/2021 1528    - Negative for Intraepithelial Lesions or Malignancy (NILM)   DIAGPAP - Benign reactive/reparative changes 07/06/2021 1528   HPVHIGH Negative 07/06/2021 1528   ADEQPAP  07/06/2021 1528    Satisfactory for evaluation; transformation zone component PRESENT.    GYN HISTORY:    Component Value Date/Time   DIAGPAP  07/06/2021 1528    - Negative for Intraepithelial Lesions or Malignancy (NILM)   DIAGPAP - Benign reactive/reparative changes 07/06/2021 1528   HPVHIGH Negative 07/06/2021 1528   ADEQPAP  07/06/2021 1528    Satisfactory for evaluation; transformation zone component PRESENT.    OB History  Gravida Para Term Preterm AB Living  2 2 2  2  SAB IAB Ectopic Multiple Live Births               # Outcome Date GA Lbr Len/2nd Weight Sex Type Anes PTL Lv  2 Term           1 Term             Past Medical History:  Diagnosis Date   Headache    Seizure (HCC)    eclamptic    Past Surgical History:  Procedure Laterality Date   BREAST BIOPSY Right 01/30/2014   benign   BREAST SURGERY     Breast Bx benign   GALLBLADDER SURGERY  08/2005   KNEE ARTHROSCOPY  02/1987   bi-lat     Current Outpatient Medications on File Prior to Visit  Medication Sig Dispense Refill    Ascorbic Acid (VITAMIN C) 100 MG tablet Take 100 mg by mouth daily.     cholecalciferol (VITAMIN D) 1000 UNITS tablet Take 1,000 Units by mouth daily.     fish oil-omega-3 fatty acids 1000 MG capsule Take 2 g by mouth daily.     SUMAtriptan (IMITREX) 50 MG tablet TAKE 1 TAB BY MOUTH EVERY 2 HOURS AS NEEDED FOR MIGRAINE. MAY REPEAT IN 2 HOURS IF HEADACHE PERSISTS 10 tablet 3   No current facility-administered medications on file prior to visit.    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 Drinks one glass of sweet tea a day. Exercises 3-4 x a week.   Social Determinants of Health   Financial Resource Strain: Not on file  Food Insecurity: Not on file  Transportation Needs: Not on file  Physical Activity: Sufficiently Active (09/30/2017)   Exercise Vital Sign    Days of Exercise per Week: 4 days    Minutes of Exercise per Session: 60 min  Stress: Not on file  Social Connections: Not on file  Intimate Partner Violence: Not on file    Family History  Problem Relation Age of Onset   Breast cancer Mother    Hypertension Mother    Osteoarthritis Mother    Osteoporosis Mother    Hypertension Father    Heart disease Father        quad bypass   Osteoporosis Maternal Grandfather     Breast cancer Paternal Grandmother 40   Breast cancer Paternal Aunt 66     Allergies  Allergen Reactions   Gluten Meal Itching   Peanut-Containing Drug Products Itching     Review of Systems Alls systems reviewed and are negative.     A:         symptomatic fibroid uterus  Discussed ultrasound results with patient.  Discussed stable appearance of the fibroid.  Due to symptoms patient is expressing, we discussed possible necrotic fibroid, and it would not be unreasonable to have the [facility], as the pain is likely directly coming from the fibroid.  Discussed the procedure in detail the risk benefits alternatives were discussed.  Discussed the benefits with improved outcomes and faster return to normal activities afterwards.  Also discussed she may be able to do conservative care and follow-up with ultrasounds in the future.  All questions were addressed.  Patient to consider her options.            35 minutes spent on reviewing records, imaging,  and one on one patient time and counseling patient and documentation Dr. Judith Blonder

## 2023-06-23 ENCOUNTER — Telehealth: Payer: Self-pay | Admitting: *Deleted

## 2023-06-23 DIAGNOSIS — D219 Benign neoplasm of connective and other soft tissue, unspecified: Secondary | ICD-10-CM

## 2023-06-23 DIAGNOSIS — R102 Pelvic and perineal pain: Secondary | ICD-10-CM

## 2023-06-23 NOTE — Telephone Encounter (Signed)
Patient left message. States she was seen for OV with Dr. Karma Greaser on 06/21/23, would like to proceed with surgery scheduling.   Routing to Dr. Karma Greaser.

## 2023-07-11 ENCOUNTER — Other Ambulatory Visit (HOSPITAL_COMMUNITY)
Admission: RE | Admit: 2023-07-11 | Discharge: 2023-07-11 | Disposition: A | Discharge status: Home / Self Care | Payer: No Typology Code available for payment source | Source: Ambulatory Visit | Attending: Obstetrics and Gynecology | Admitting: Obstetrics and Gynecology

## 2023-07-11 ENCOUNTER — Ambulatory Visit: Payer: No Typology Code available for payment source | Admitting: Obstetrics and Gynecology

## 2023-07-11 ENCOUNTER — Encounter: Payer: Self-pay | Admitting: Obstetrics and Gynecology

## 2023-07-11 VITALS — BP 120/70 | HR 65 | Ht 67.25 in | Wt 161.0 lb

## 2023-07-11 DIAGNOSIS — Z01818 Encounter for other preprocedural examination: Secondary | ICD-10-CM | POA: Insufficient documentation

## 2023-07-11 DIAGNOSIS — D259 Leiomyoma of uterus, unspecified: Secondary | ICD-10-CM

## 2023-07-11 DIAGNOSIS — R102 Pelvic and perineal pain: Secondary | ICD-10-CM | POA: Diagnosis not present

## 2023-07-11 MED ORDER — IBUPROFEN 800 MG PO TABS: 800.0000 mg | ORAL_TABLET | Freq: Three times a day (TID) | ORAL | 1 refills | Status: DC | PRN

## 2023-07-11 MED ORDER — OXYCODONE HCL 5 MG PO TABS: 5.0000 mg | ORAL_TABLET | ORAL | 0 refills | Status: DC | PRN

## 2023-07-11 MED ORDER — METOCLOPRAMIDE HCL 10 MG PO TABS: 10.0000 mg | ORAL_TABLET | Freq: Three times a day (TID) | ORAL | 0 refills | Status: DC | PRN

## 2023-07-11 NOTE — Addendum Note (Signed)
Addended by: Earley Favor on: 07/11/2023 05:09 PM   Modules accepted: Orders

## 2023-07-11 NOTE — Progress Notes (Signed)
/  PREOP H&P for RLH, BSO, cystoscopy  57 y.o. y.o. female here for surgical consultation for the RLH/BSO.  She has had known fibroid uterus and has been experiencing some concerning symptoms for a while.  She is not bleeding but is feeling pain and increasing pressure over the site of the fibroid. She has also noted bloating and pelvic fullness that is bothersome to her. Flarring pain with TV US lasted 3-4 days after Radiates to her back Pain after intercourse on the right as well.  Narrative & Impression  CLINICAL DATA:  Initial evaluation for right lower quadrant pain, history of fibroids.   EXAM: TRANSABDOMINAL AND TRANSVAGINAL ULTRASOUND OF PELVIS   TECHNIQUE: Both transabdominal and transvaginal ultrasound examinations of the pelvis were performed. Transabdominal technique was performed for global imaging of the pelvis including uterus, ovaries, adnexal regions, and pelvic cul-de-sac. It was necessary to proceed with endovaginal exam following the transabdominal exam to visualize the endometrium.   COMPARISON:  Prior study from 07/23/2021.   FINDINGS: Uterus   Measurements: 10.1 x 6.4 x 6.4 cm = volume: 214.3 mL. Uterus is anteverted. Large intramural fibroid centered at the uterine fundus measures 5.6 x 4.5 x 5.0 cm.   Endometrium   Thickness: 3.9 mm.  No focal abnormality visualized.   Right ovary   Not visualized.  No adnexal mass.   Left ovary   Not visualized.  No adnexal mass.   Other findings   No abnormal free fluid.   IMPRESSION: 1. 5.6 cm intramural fibroid centered at the uterine fundus. 2. Normal endometrium measuring up to 3.9 mm in thickness. 3. Nonvisualization of either ovary.  No adnexal mass or free fluid.     Electronically Signed   By: Rise Mu M.D.   On: 05/18/2023 18:16      Result History  US PELVIC COMPLETE WITH TRANSVAGINAL (Order #[id number]) on 05/18/2023 - Order Result History Report   Height Weight BMI  (Calculated)   161 lb (73 kg)    Imaging 07/23/21  PACS Intelerad Image Link    Narrative & Impression  T/V images.  Anteverted enlarged uterus with a single intramural fibroid to the right side measured at 6 x 5 cm.  The overall uterine size is measured at 10.47 x 8.33 x 7.37 cm.  The endometrial lining is thin and symmetrical measured at 3.9 mm with no mass or thickening seen.  The cavity is diverted to the left side by the fibroid.  Left ovary is small with atrophic appearance.  Right ovary with a simple follicle measured at 2.1 x 1.7 cm.  No adnexal mass.  No free fluid in the pelvis.      Blood pressure 120/70, pulse 65, height 5' 7.25" (1.708 m), weight 161 lb (73 kg), last menstrual period 04/17/2020, SpO2 97%.     Component Value Date/Time   DIAGPAP  07/06/2021 1528    - Negative for Intraepithelial Lesions or Malignancy (NILM)   DIAGPAP - Benign reactive/reparative changes 07/06/2021 1528   HPVHIGH Negative 07/06/2021 1528   ADEQPAP  07/06/2021 1528    Satisfactory for evaluation; transformation zone component PRESENT.    GYN HISTORY:    Component Value Date/Time   DIAGPAP  07/06/2021 1528    - Negative for Intraepithelial Lesions or Malignancy (NILM)   DIAGPAP - Benign reactive/reparative changes 07/06/2021 1528   HPVHIGH Negative 07/06/2021 1528   ADEQPAP  07/06/2021 1528    Satisfactory for evaluation; transformation zone component PRESENT.    OB  History  Gravida Para Term Preterm AB Living  2 2 2     2   SAB IAB Ectopic Multiple Live Births               # Outcome Date GA Lbr Len/2nd Weight Sex Type Anes PTL Lv  2 Term           1 Term             Past Medical History:  Diagnosis Date   Headache    Seizure (HCC)    eclamptic    Past Surgical History:  Procedure Laterality Date   BREAST BIOPSY Right 01/30/2014   benign   BREAST SURGERY     Breast Bx benign   GALLBLADDER SURGERY  08/2005   KNEE ARTHROSCOPY  02/1987   bi-lat     Current  Outpatient Medications on File Prior to Visit  Medication Sig Dispense Refill   Ascorbic Acid (VITAMIN C) 100 MG tablet Take 100 mg by mouth daily.     cholecalciferol (VITAMIN D) 1000 UNITS tablet Take 1,000 Units by mouth daily.     fish oil-omega-3 fatty acids 1000 MG capsule Take 2 g by mouth daily.     SUMAtriptan (IMITREX) 50 MG tablet TAKE 1 TAB BY MOUTH EVERY 2 HOURS AS NEEDED FOR MIGRAINE. MAY REPEAT IN 2 HOURS IF HEADACHE PERSISTS (Patient not taking: Reported on 07/11/2023) 10 tablet 3   No current facility-administered medications on file prior to visit.    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 Drinks one glass of sweet tea a day. Exercises 3-4 x a week.   Social Determinants of Health   Financial Resource Strain: Not on file  Food Insecurity: Not on file  Transportation Needs: Not on file  Physical Activity: Sufficiently Active (09/30/2017)   Exercise Vital Sign    Days of Exercise per Week: 4 days    Minutes of Exercise per Session: 60 min  Stress: Not on file  Social Connections: Not on file  Intimate Partner Violence: Not on file    Family History  Problem Relation Age of Onset   Breast cancer Mother    Hypertension Mother    Osteoarthritis Mother     Osteoporosis Mother    Hypertension Father    Heart disease Father        quad bypass   Osteoporosis Maternal Grandfather    Breast cancer Paternal Grandmother 10   Breast cancer Paternal Aunt 31     Allergies  Allergen Reactions   Gluten Meal Itching   Peanut-Containing Drug Products Itching     PROCEDURE: EMB Consent obtained for the procedure.  A bivalve speculum  was placed in the vagina.  The cervix was grasped with a single tooth tenaculum.  Pipelle was inserted and rotated.  Uterus sound to 9cm. Adequate specimen was obtained and sent to pathology.  All instruments were removed.  Patient tolerated the procedure well.  To notify patient of the results.  Review of Systems Alls systems reviewed and are negative.     A:         symptomatic fibroid uterus              The risk, benefit,s alternatives of the procedure were discussed with patient including but not limited to the risk for bleeding, infection, injury to surrounding structures such as the bowel, vessels, bladder, and ureters were reviewed.  The risk for blood products and risk for an additional procedure, and risk for laparotomy in an extreme event was discussed.  The risk for blood clots are also discussed.  Postoperative care instructions were discussed and reviewed with patient.  Postoperative medications were sent to her pharmacy and patient was instructed to pick up prior to surgery.  She will have a driver to take her there and take her home and she agrees that that person will stay with her overnight. Post care instructions were also put in the wrap-up section of this note for patient, as a reminder of care instructions. All questions were answered and patient agrees and would like to proceed with the procedure.  Earley Favor

## 2023-07-11 NOTE — H&P (View-Only) (Signed)
/  PREOP H&P for RLH, BSO, cystoscopy  57 y.o. y.o. female here for surgical consultation for the RLH/BSO.  She has had known fibroid uterus and has been experiencing some concerning symptoms for a while.  She is not bleeding but is feeling pain and increasing pressure over the site of the fibroid. She has also noted bloating and pelvic fullness that is bothersome to her. Flarring pain with TV US lasted 3-4 days after Radiates to her back Pain after intercourse on the right as well.  Narrative & Impression  CLINICAL DATA:  Initial evaluation for right lower quadrant pain, history of fibroids.   EXAM: TRANSABDOMINAL AND TRANSVAGINAL ULTRASOUND OF PELVIS   TECHNIQUE: Both transabdominal and transvaginal ultrasound examinations of the pelvis were performed. Transabdominal technique was performed for global imaging of the pelvis including uterus, ovaries, adnexal regions, and pelvic cul-de-sac. It was necessary to proceed with endovaginal exam following the transabdominal exam to visualize the endometrium.   COMPARISON:  Prior study from 07/23/2021.   FINDINGS: Uterus   Measurements: 10.1 x 6.4 x 6.4 cm = volume: 214.3 mL. Uterus is anteverted. Large intramural fibroid centered at the uterine fundus measures 5.6 x 4.5 x 5.0 cm.   Endometrium   Thickness: 3.9 mm.  No focal abnormality visualized.   Right ovary   Not visualized.  No adnexal mass.   Left ovary   Not visualized.  No adnexal mass.   Other findings   No abnormal free fluid.   IMPRESSION: 1. 5.6 cm intramural fibroid centered at the uterine fundus. 2. Normal endometrium measuring up to 3.9 mm in thickness. 3. Nonvisualization of either ovary.  No adnexal mass or free fluid.     Electronically Signed   By: Rise Mu M.D.   On: 05/18/2023 18:16      Result History  US PELVIC COMPLETE WITH TRANSVAGINAL (Order #[id number]) on 05/18/2023 - Order Result History Report   Height Weight BMI  (Calculated)   161 lb (73 kg)    Imaging 07/23/21  PACS Intelerad Image Link    Narrative & Impression  T/V images.  Anteverted enlarged uterus with a single intramural fibroid to the right side measured at 6 x 5 cm.  The overall uterine size is measured at 10.47 x 8.33 x 7.37 cm.  The endometrial lining is thin and symmetrical measured at 3.9 mm with no mass or thickening seen.  The cavity is diverted to the left side by the fibroid.  Left ovary is small with atrophic appearance.  Right ovary with a simple follicle measured at 2.1 x 1.7 cm.  No adnexal mass.  No free fluid in the pelvis.      Blood pressure 120/70, pulse 65, height 5' 7.25" (1.708 m), weight 161 lb (73 kg), last menstrual period 04/17/2020, SpO2 97%.     Component Value Date/Time   DIAGPAP  07/06/2021 1528    - Negative for Intraepithelial Lesions or Malignancy (NILM)   DIAGPAP - Benign reactive/reparative changes 07/06/2021 1528   HPVHIGH Negative 07/06/2021 1528   ADEQPAP  07/06/2021 1528    Satisfactory for evaluation; transformation zone component PRESENT.    GYN HISTORY:    Component Value Date/Time   DIAGPAP  07/06/2021 1528    - Negative for Intraepithelial Lesions or Malignancy (NILM)   DIAGPAP - Benign reactive/reparative changes 07/06/2021 1528   HPVHIGH Negative 07/06/2021 1528   ADEQPAP  07/06/2021 1528    Satisfactory for evaluation; transformation zone component PRESENT.    OB  History  Gravida Para Term Preterm AB Living  2 2 2     2   SAB IAB Ectopic Multiple Live Births               # Outcome Date GA Lbr Len/2nd Weight Sex Type Anes PTL Lv  2 Term           1 Term             Past Medical History:  Diagnosis Date   Headache    Seizure (HCC)    eclamptic    Past Surgical History:  Procedure Laterality Date   BREAST BIOPSY Right 01/30/2014   benign   BREAST SURGERY     Breast Bx benign   GALLBLADDER SURGERY  08/2005   KNEE ARTHROSCOPY  02/1987   bi-lat     Current  Outpatient Medications on File Prior to Visit  Medication Sig Dispense Refill   Ascorbic Acid (VITAMIN C) 100 MG tablet Take 100 mg by mouth daily.     cholecalciferol (VITAMIN D) 1000 UNITS tablet Take 1,000 Units by mouth daily.     fish oil-omega-3 fatty acids 1000 MG capsule Take 2 g by mouth daily.     SUMAtriptan (IMITREX) 50 MG tablet TAKE 1 TAB BY MOUTH EVERY 2 HOURS AS NEEDED FOR MIGRAINE. MAY REPEAT IN 2 HOURS IF HEADACHE PERSISTS (Patient not taking: Reported on 07/11/2023) 10 tablet 3   No current facility-administered medications on file prior to visit.    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 Drinks one glass of sweet tea a day. Exercises 3-4 x a week.   Social Determinants of Health   Financial Resource Strain: Not on file  Food Insecurity: Not on file  Transportation Needs: Not on file  Physical Activity: Sufficiently Active (09/30/2017)   Exercise Vital Sign    Days of Exercise per Week: 4 days    Minutes of Exercise per Session: 60 min  Stress: Not on file  Social Connections: Not on file  Intimate Partner Violence: Not on file    Family History  Problem Relation Age of Onset   Breast cancer Mother    Hypertension Mother    Osteoarthritis Mother     Osteoporosis Mother    Hypertension Father    Heart disease Father        quad bypass   Osteoporosis Maternal Grandfather    Breast cancer Paternal Grandmother 58   Breast cancer Paternal Aunt 75     Allergies  Allergen Reactions   Gluten Meal Itching   Peanut-Containing Drug Products Itching     PROCEDURE: EMB Consent obtained for the procedure.  A bivalve speculum  was placed in the vagina.  The cervix was grasped with a single tooth tenaculum.  Pipelle was inserted and rotated.  Uterus sound to 9cm. Adequate specimen was obtained and sent to pathology.  All instruments were removed.  Patient tolerated the procedure well.  To notify patient of the results.  Review of Systems Alls systems reviewed and are negative.     A:         symptomatic fibroid uterus              The risk, benefit,s alternatives of the procedure were discussed with patient including but not limited to the risk for bleeding, infection, injury to surrounding structures such as the bowel, vessels, bladder, and ureters were reviewed.  The risk for blood products and risk for an additional procedure, and risk for laparotomy in an extreme event was discussed.  The risk for blood clots are also discussed.  Postoperative care instructions were discussed and reviewed with patient.  Postoperative medications were sent to her pharmacy and patient was instructed to pick up prior to surgery.  She will have a driver to take her there and take her home and she agrees that that person will stay with her overnight. Post care instructions were also put in the wrap-up section of this note for patient, as a reminder of care instructions. All questions were answered and patient agrees and would like to proceed with the procedure.  Earley Favor

## 2023-07-11 NOTE — Patient Instructions (Addendum)
Robotic Laparoscopic Hysterectomy, Care After  The following information offers guidance on how to care for yourself after your procedure. Your health care provider may also give you more specific instructions. If you have problems or questions, contact your health care provider. What can I expect after the procedure? After the procedure, it is common to have: Pain, bruising, and numbness around your incisions. Tiredness (fatigue). Poor appetite. Less interest in sex. Vaginal discharge or bleeding. You will need to use a sanitary pad after this procedure.  HEAVY BLEEDING LIKE A PERIOD IS NOT NORMAL.  PLEASE CALL YOUR PROVIDER IF SOAKING A PAD. Feelings of sadness or other emotions. If your ovaries were also removed, it is also common to have symptoms of menopause, such as hot flashes, night sweats, and lack of sleep (insomnia).  Ovaries should stay in if at all possible until at least the age of 67. Follow these instructions at home: Medicines Take over-the-counter and prescription medicines only as told by your health care provider. Ask your health care provider if the medicine prescribed to you: Requires you to avoid driving or using machinery. Can cause constipation. You may need to take these actions to prevent or treat constipation: Drink enough fluid to keep your urine pale yellow. Take over-the-counter or prescription medicines. Eat foods that are high in fiber, such as beans, whole grains, and fresh fruits and vegetables. Limit foods that are high in fat and processed sugars, such as fried or sweet foods.  Also, avoid spicy foods.  NAUSEA IS COMMON THE FIRST NIGHT OF SURGERY.  IF IT LASTS BEYOND 24 HOURS, CALL YOUR PROVIDER.  NAUSEA MEDICATION WAS GIVEN AT YOUR PREOP APPOINTMENT THAT YOU CAN TAKE AFTER SURGERY. Incision care  Follow instructions from your health care provider about how to take care of your incisions. Make sure you: LEAVE INCISION OPEN AND DRY-NO BANDAGES Leave  stitches (sutures), skin glue, or adhesive strips in place UNTIL 2 WEEKS THEN REMOVE IN THE SHOWER.  If adhesive strip edges start to loosen and curl up, you may trim the loose edges. Check your incision areas every day for signs of infection. Check for: More redness, swelling, or pain. Fluid or blood. Warmth. Pus or a bad smell. Activity  Rest as told by your health care provider. Avoid sitting for a long time without moving. Get up to take short walks every 1-2 hours. This is important to improve blood flow and breathing. Ask for help if you feel weak or unsteady. Return to your normal activities as told by your health care provider. Ask your health care provider what activities are safe for you. Do not lift anything that is heavier than 10 lb (4.5 kg), or the limit that you are told, for 8 WEEKS after surgery or until your health care provider says that it is safe. If you were given a sedative during the procedure, it can affect you for several hours. Do not drive or operate machinery until your health care provider says that it is safe. Lifestyle Do not use any products that contain nicotine or tobacco. These products include cigarettes, chewing tobacco, and vaping devices, such as e-cigarettes. These can delay healing after surgery. If you need help quitting, ask your health care provider. Do not drink alcohol until your health care provider approves. General instructions FOR 2 WEEKS AFTER SURGERY, THEN YOU MAY USE TUBS AND HOT TUBS Do not douche, use tampons, or have sex for at least 10 weeks, or as told by your health care  provider. If you struggle with physical or emotional changes after your procedure, speak with your health care provider or a therapist. Do not take baths, swim, or use a hot tub until your health care provider approves. You may only be allowed to take showers for 2 weeks. IF YOU HAVE BURNING WITH URINATION, PLEASE CALL YOUR DOCTOR. BLADDER INFECTIONS MAY OCCUR AFTER  SURGERY Try to have someone at home with you for the first 1-2 weeks to help with your daily chores. Wear compression stockings as told by your health care provider. These stockings help to prevent blood clots and reduce swelling in your legs. Keep all follow-up visits. This is important. Contact a health care provider if: You have any of these signs of infection: Chills or a fever 155f OR GREATER. More redness, swelling, or pain around an incision. Fluid or blood coming from an incision. Warmth coming from an incision. Pus or a bad smell coming from an incision. Burning with urination An incision opens. You feel dizzy or light-headed. You have pain or bleeding when you urinate, or you are unable to urinate. You have abnormal vaginal discharge. You have pain that does not get better with medicine. Get help right away if: You have a fever and your symptoms suddenly get worse. You have severe abdominal pain. You have chest pain or shortness of breath. You may have chest pain and shortness of breath from the CO2 gas for a few days after surgery.  This is very common.  Walking, Gas-X and motrin will usually help relieve this discomfort You faint. You have pain, swelling, or redness in your leg. You have heavy vaginal bleeding with blood clots, soaking through a sanitary pad in less than 1 hour. These symptoms may represent a serious problem that is an emergency. Do not wait to see if the symptoms will go away. Get medical help right away. Call your local emergency services (911 in the [place] Do not drive yourself to the hospital. Summary After the procedure, it is common to have pain and bruising around your incisions. Do not take baths, swim, or use a hot tub until your health care provider approves. Do not lift anything that is heavier than 10 lb (4.5 kg), or the limit that you are told, for one month after surgery or until your health care provider says that it is safe. Tell your health  care provider if you have any signs or symptoms of infection after the procedure. Get help right away if you have severe abdominal pain, chest pain, shortness of breath, or heavy bleeding from your vagina. This information is not intended to replace advice given to you by your health care provider. Make sure you discuss any questions you have with your health care provider. Document Revised: 06/05/2020 Document Reviewed: 06/06/2020 Elsevier Patient Education  2024 [employer].

## 2023-07-12 MED ORDER — ESTRADIOL 0.1 MG/GM VA CREA: 1.0000 | TOPICAL_CREAM | VAGINAL | 12 refills | Status: DC

## 2023-07-12 NOTE — Addendum Note (Signed)
Addended by: Earley Favor on: 07/12/2023 08:29 AM   Modules accepted: Orders

## 2023-07-13 LAB — SURGICAL PATHOLOGY

## 2023-07-18 ENCOUNTER — Encounter: Payer: Self-pay | Admitting: *Deleted

## 2023-07-18 ENCOUNTER — Other Ambulatory Visit: Payer: Self-pay

## 2023-07-18 ENCOUNTER — Encounter (HOSPITAL_BASED_OUTPATIENT_CLINIC_OR_DEPARTMENT_OTHER): Payer: Self-pay | Admitting: Obstetrics and Gynecology

## 2023-07-18 DIAGNOSIS — Z01812 Encounter for preprocedural laboratory examination: Secondary | ICD-10-CM | POA: Diagnosis present

## 2023-07-18 NOTE — Progress Notes (Signed)
Your procedure is scheduled on Monday, 08/01/2023.  Report to [facility] AT  11:00 AM.   Call this number if you have problems the morning of surgery  :[phone].   OUR ADDRESS IS [address].  WE ARE LOCATED IN [facility].  PLEASE BRING YOUR INSURANCE CARD AND PHOTO ID DAY OF SURGERY.  ONLY 2 PEOPLE ARE ALLOWED IN  WAITING  ROOM                                      REMEMBER:  DO NOT EAT FOOD, CANDY GUM OR MINTS  AFTER MIDNIGHT THE NIGHT BEFORE YOUR SURGERY . YOU MAY HAVE CLEAR LIQUIDS FROM MIDNIGHT THE NIGHT BEFORE YOUR SURGERY UNTIL  10:00 AM. NO CLEAR LIQUIDS AFTER   10:00 AM DAY OF SURGERY.  YOU MAY  BRUSH YOUR TEETH MORNING OF SURGERY AND RINSE YOUR MOUTH OUT, NO CHEWING GUM CANDY OR MINTS.     CLEAR LIQUID DIET    Allowed      Water                                                                   Coffee and tea, regular and decaf  (NO cream or milk products of any type, may sweeten)                         Carbonated beverages, regular and diet                                    Sports drinks like Gatorade _____________________________________________________________________     TAKE ONLY THESE MEDICATIONS MORNING OF SURGERY: NONE                                        DO NOT WEAR JEWERLY/  METAL/  PIERCINGS (INCLUDING NO PLASTIC PIERCINGS) DO NOT WEAR LOTIONS, POWDERS, PERFUMES OR NAIL POLISH ON YOUR FINGERNAILS. TOENAIL POLISH IS OK TO WEAR. DO NOT SHAVE FOR 48 HOURS PRIOR TO DAY OF SURGERY.  CONTACTS, GLASSES, OR DENTURES MAY NOT BE WORN TO SURGERY.  REMEMBER: NO SMOKING, VAPING ,  DRUGS OR ALCOHOL FOR 24 HOURS BEFORE YOUR SURGERY.                                    [facility] IS NOT RESPONSIBLE  FOR ANY BELONGINGS.                                                                    Marland Kitchen           [employer] - Preparing for Surgery Before surgery,  you can play an important role.  Because skin is not sterile,  your skin needs to be as free of germs as possible.  You can reduce the number of germs on your skin by washing with CHG (chlorahexidine gluconate) soap before surgery.  CHG is an antiseptic cleaner which kills germs and bonds with the skin to continue killing germs even after washing. Please DO NOT use if you have an allergy to CHG or antibacterial soaps.  If your skin becomes reddened/irritated stop using the CHG and inform your nurse when you arrive at Short Stay. Do not shave (including legs and underarms) for at least 48 hours prior to the first CHG shower.  You may shave your face/neck. Please follow these instructions carefully:  1.  Shower with CHG Soap the night before surgery and the  morning of Surgery.  2.  If you choose to wash your hair, wash your hair first as usual with your  normal  shampoo.  3.  After you shampoo, rinse your hair and body thoroughly to remove the  shampoo.                                        4.  Use CHG as you would any other liquid soap.  You can apply chg directly  to the skin and wash , chg soap provided, night before and morning of your surgery.  5.  Apply the CHG Soap to your body ONLY FROM THE NECK DOWN.   Do not use on face/ open                           Wound or open sores. Avoid contact with eyes, ears mouth and genitals (private parts).                       Wash face,  Genitals (private parts) with your normal soap.             6.  Wash thoroughly, paying special attention to the area where your surgery  will be performed.  7.  Thoroughly rinse your body with warm water from the neck down.  8.  DO NOT shower/wash with your normal soap after using and rinsing off  the CHG Soap.             9.  Pat yourself dry with a clean towel.            10.  Wear clean pajamas.            11.  Place clean sheets on your bed the night of your first shower and do not  sleep with pets. Day of Surgery : Do not apply any lotions/ powders the morning of surgery.   Please wear clean clothes to the hospital/surgery center.  IF YOU HAVE ANY SKIN IRRITATION OR PROBLEMS WITH THE SURGICAL SOAP, PLEASE GET A BAR OF GOLD DIAL SOAP AND SHOWER THE NIGHT BEFORE YOUR SURGERY AND THE MORNING OF YOUR SURGERY. PLEASE LET THE NURSE KNOW MORNING OF YOUR SURGERY IF YOU HAD ANY PROBLEMS WITH THE SURGICAL SOAP.   YOUR SURGEON MAY HAVE REQUESTED EXTENDED RECOVERY TIME AFTER YOUR SURGERY. IT COULD BE A  JUST A FEW HOURS  UP TO AN OVERNIGHT STAY.  YOUR SURGEON SHOULD HAVE DISCUSSED THIS WITH YOU PRIOR TO YOUR SURGERY.  IN THE EVENT YOU NEED TO STAY OVERNIGHT PLEASE REFER TO THE FOLLOWING GUIDELINES. YOU MAY HAVE UP TO 4 VISITORS  MAY VISIT IN THE EXTENDED RECOVERY ROOM UNTIL 800 PM ONLY.  ONE  VISITOR AGE 101 AND OVER MAY SPEND THE NIGHT AND MUST BE IN EXTENDED RECOVERY ROOM NO LATER THAN 800 PM . YOUR DISCHARGE TIME AFTER YOU SPEND THE NIGHT IS 900 AM THE MORNING AFTER YOUR SURGERY. YOU MAY PACK A SMALL OVERNIGHT BAG WITH TOILETRIES FOR YOUR OVERNIGHT STAY IF YOU WISH.  REGARDLESS OF IF YOU STAY OVER NIGHT OR ARE DISCHARGED THE SAME DAY YOU WILL BE REQUIRED TO HAVE A RESPONSIBLE ADULT (18 YRS OLD OR OLDER) STAY WITH YOU FOR AT LEAST THE FIRST 24 HOURS  YOUR PRESCRIPTION MEDICATIONS WILL BE PROVIDED DURING YOUR HOSPITAL STAY.  ________________________________________________________________________                                                        QUESTIONS Mechele Claude PRE OP NURSE PHONE [phone].

## 2023-07-18 NOTE — Progress Notes (Signed)
Spoke w/ via phone for pre-op interview---Jamie Hale needs dos----  none per anesthesia, surgeon orders pending       Hale results------07/26/23 Hale appt for cbc, type & screen COVID test -----patient states asymptomatic no test needed Arrive at -------1100 on Monday, 08/01/23 NPO after MN NO Solid Food.  Clear liquids from MN until---1000 Med rec completed Medications to take morning of surgery -----none Diabetic medication -----n/a Patient instructed no nail polish to be worn day of surgery Patient instructed to bring photo id and insurance card day of surgery Patient aware to have Driver (ride ) / caregiver    for 24 hours after surgery - husband, Jamie Hale Patient Special Instructions -----Extended / overnight stay instructions given. Pre-Op special Instructions -----Requested orders from Dr. Karma Greaser via Epic IB on 07/18/23. Patient verbalized understanding of instructions that were given at this phone interview. Patient denies chest pain, sob, fever, cough at the interview.

## 2023-07-26 ENCOUNTER — Encounter (HOSPITAL_COMMUNITY)
Admission: RE | Admit: 2023-07-26 | Discharge: 2023-07-26 | Disposition: A | Discharge status: Home / Self Care | Payer: No Typology Code available for payment source | Source: Ambulatory Visit | Attending: Obstetrics and Gynecology | Admitting: Obstetrics and Gynecology

## 2023-07-26 DIAGNOSIS — Z01818 Encounter for other preprocedural examination: Secondary | ICD-10-CM

## 2023-07-26 DIAGNOSIS — Z01812 Encounter for preprocedural laboratory examination: Secondary | ICD-10-CM | POA: Diagnosis not present

## 2023-07-26 LAB — CBC
HCT: 44.7 % (ref 36.0–46.0)
Hemoglobin: 14.9 g/dL (ref 12.0–15.0)
MCH: 31.1 pg (ref 26.0–34.0)
MCHC: 33.3 g/dL (ref 30.0–36.0)
MCV: 93.3 fL (ref 80.0–100.0)
Platelets: 173 10*3/uL (ref 150–400)
RBC: 4.79 MIL/uL (ref 3.87–5.11)
RDW: 11.8 % (ref 11.5–15.5)
WBC: 4 10*3/uL (ref 4.0–10.5)
nRBC: 0 % (ref 0.0–0.2)

## 2023-07-29 NOTE — Progress Notes (Signed)
I called patient and told her that her new arrival time for surgery on 08/01/23 was 0800 and that she should stop clear liquids by 0700.

## 2023-07-29 NOTE — H&P (Signed)
/  PREOP H&P for RLH, BSO, cystoscopy  57 y.o. y.o. female here for surgical consultation for the RLH/BSO.  She has had known fibroid uterus and has been experiencing some concerning symptoms for a while.  She is not bleeding but is feeling pain and increasing pressure over the site of the fibroid. She has also noted bloating and pelvic fullness that is bothersome to her. Flarring pain with TV US lasted 3-4 days after Radiates to her back Pain after intercourse on the right as well.  Narrative & Impression  CLINICAL DATA:  Initial evaluation for right lower quadrant pain, history of fibroids.   EXAM: TRANSABDOMINAL AND TRANSVAGINAL ULTRASOUND OF PELVIS   TECHNIQUE: Both transabdominal and transvaginal ultrasound examinations of the pelvis were performed. Transabdominal technique was performed for global imaging of the pelvis including uterus, ovaries, adnexal regions, and pelvic cul-de-sac. It was necessary to proceed with endovaginal exam following the transabdominal exam to visualize the endometrium.   COMPARISON:  Prior study from 07/23/2021.   FINDINGS: Uterus   Measurements: 10.1 x 6.4 x 6.4 cm = volume: 214.3 mL. Uterus is anteverted. Large intramural fibroid centered at the uterine fundus measures 5.6 x 4.5 x 5.0 cm.   Endometrium   Thickness: 3.9 mm.  No focal abnormality visualized.   Right ovary   Not visualized.  No adnexal mass.   Left ovary   Not visualized.  No adnexal mass.   Other findings   No abnormal free fluid.   IMPRESSION: 1. 5.6 cm intramural fibroid centered at the uterine fundus. 2. Normal endometrium measuring up to 3.9 mm in thickness. 3. Nonvisualization of either ovary.  No adnexal mass or free fluid.     Electronically Signed   By: Rise Mu M.D.   On: 05/18/2023 18:16      Result History  US PELVIC COMPLETE WITH TRANSVAGINAL (Order #[id number]) on 05/18/2023 - Order Result History Report   Height Weight BMI  (Calculated)   161 lb (73 kg)    Imaging 07/23/21  PACS Intelerad Image Link    Narrative & Impression  T/V images.  Anteverted enlarged uterus with a single intramural fibroid to the right side measured at 6 x 5 cm.  The overall uterine size is measured at 10.47 x 8.33 x 7.37 cm.  The endometrial lining is thin and symmetrical measured at 3.9 mm with no mass or thickening seen.  The cavity is diverted to the left side by the fibroid.  Left ovary is small with atrophic appearance.  Right ovary with a simple follicle measured at 2.1 x 1.7 cm.  No adnexal mass.  No free fluid in the pelvis.      Height 5\' 8"  (1.727 m), weight 72.6 kg, last menstrual period 04/17/2020.     Component Value Date/Time   DIAGPAP  07/06/2021 1528    - Negative for Intraepithelial Lesions or Malignancy (NILM)   DIAGPAP - Benign reactive/reparative changes 07/06/2021 1528   HPVHIGH Negative 07/06/2021 1528   ADEQPAP  07/06/2021 1528    Satisfactory for evaluation; transformation zone component PRESENT.    GYN HISTORY:    Component Value Date/Time   DIAGPAP  07/06/2021 1528    - Negative for Intraepithelial Lesions or Malignancy (NILM)   DIAGPAP - Benign reactive/reparative changes 07/06/2021 1528   HPVHIGH Negative 07/06/2021 1528   ADEQPAP  07/06/2021 1528    Satisfactory for evaluation; transformation zone component PRESENT.    OB History  Gravida Para Term Preterm AB Living  2 2 2     2   SAB IAB Ectopic Multiple Live Births               # Outcome Date GA Lbr Len/2nd Weight Sex Type Anes PTL Lv  2 Term           1 Term             Past Medical History:  Diagnosis Date   Headache    hx of migraines   Hypotension    Patient states that blood pressure sometimes runs in the 90's over 50's.   Seizure (HCC) 1994   eclamptic, no seizures since   Wears glasses     Past Surgical History:  Procedure Laterality Date   BREAST BIOPSY Right 01/30/2014   benign   COLONOSCOPY  2023    GALLBLADDER SURGERY  08/2005   KNEE ARTHROSCOPY  02/1987   bi-lat     No current facility-administered medications on file prior to encounter.   Current Outpatient Medications on File Prior to Encounter  Medication Sig Dispense Refill   Ascorbic Acid (VITAMIN C) 100 MG tablet Take 100 mg by mouth daily.     cholecalciferol (VITAMIN D) 1000 UNITS tablet Take 1,000 Units by mouth daily.     fish oil-omega-3 fatty acids 1000 MG capsule Take 2 g by mouth daily.     SUMAtriptan (IMITREX) 50 MG tablet TAKE 1 TAB BY MOUTH EVERY 2 HOURS AS NEEDED FOR MIGRAINE. MAY REPEAT IN 2 HOURS IF HEADACHE PERSISTS (Patient not taking: Reported on 07/11/2023) 10 tablet 3    Social History   Socioeconomic History   Marital status: Married    Spouse name: Tawanna Cooler   Number of children: 2   Years of education: Not on file   Highest education level: Bachelor's degree (e.g., BA, AB, BS)  Occupational History   Occupation: Technical brewer: [employer]  Tobacco Use   Smoking status: Never   Smokeless tobacco: Never  Vaping Use   Vaping status: Never Used  Substance and Sexual Activity   Alcohol use: No   Drug use: No   Sexual activity: Yes    Partners: Male    Birth control/protection: Condom, Post-menopausal    Comment: 1st intercourse 57 yo-Fewer than 5 partners  Other Topics Concern   Not on file  Social History Narrative   Married, lives with husband and 2 children in a one story home. Drinks one glass of sweet tea a day. Exercises 3-4 x a week.   Social Determinants of Health   Financial Resource Strain: Not on file  Food Insecurity: Not on file  Transportation Needs: Not on file  Physical Activity: Sufficiently Active (09/30/2017)   Exercise Vital Sign    Days of Exercise per Week: 4 days    Minutes of Exercise per Session: 60 min  Stress: Not on file  Social Connections: Not on file  Intimate Partner Violence: Not on file    Family History  Problem Relation Age of  Onset   Breast cancer Mother    Hypertension Mother    Osteoarthritis Mother    Osteoporosis Mother    Hypertension Father    Heart disease Father        quad bypass   Osteoporosis Maternal Grandfather    Breast cancer Paternal Grandmother 110   Breast cancer Paternal Aunt 66     Allergies  Allergen Reactions   Gluten Meal Itching   Peanut-Containing Drug Products  Itching     PROCEDURE: EMB Consent obtained for the procedure.  A bivalve speculum was placed in the vagina.  The cervix was grasped with a single tooth tenaculum.  Pipelle was inserted and rotated.  Uterus sound to 9cm. Adequate specimen was obtained and sent to pathology.  All instruments were removed.  Patient tolerated the procedure well.  To notify patient of the results.  Review of Systems Alls systems reviewed and are negative.     A:        symptomatic fibroid uterus              The risk, benefit,s alternatives of the procedure were discussed with patient including but not limited to the risk for bleeding, infection, injury to surrounding structures such as the bowel, vessels, bladder, and ureters were reviewed.  The risk for blood products and risk for an additional procedure, and risk for laparotomy in an extreme event was discussed.  The risk for blood clots are also discussed.  Postoperative care instructions were discussed and reviewed with patient.  Postoperative medications were sent to her pharmacy and patient was instructed to pick up prior to surgery.  She will have a driver to take her there and take her home and she agrees that that person will stay with her overnight. Post care instructions were also put in the wrap-up section of this note for patient, as a reminder of care instructions. All questions were answered and patient agrees and would like to proceed with the procedure.  Earley Favor

## 2023-08-01 ENCOUNTER — Encounter (HOSPITAL_BASED_OUTPATIENT_CLINIC_OR_DEPARTMENT_OTHER)
Admission: RE | Disposition: A | Discharge status: Home / Self Care | Payer: Self-pay | Source: Home / Self Care | Attending: Obstetrics and Gynecology

## 2023-08-01 ENCOUNTER — Ambulatory Visit (HOSPITAL_BASED_OUTPATIENT_CLINIC_OR_DEPARTMENT_OTHER): Payer: No Typology Code available for payment source | Admitting: Certified Registered"

## 2023-08-01 ENCOUNTER — Other Ambulatory Visit: Payer: Self-pay

## 2023-08-01 ENCOUNTER — Encounter (HOSPITAL_BASED_OUTPATIENT_CLINIC_OR_DEPARTMENT_OTHER): Payer: Self-pay | Admitting: Obstetrics and Gynecology

## 2023-08-01 ENCOUNTER — Ambulatory Visit (HOSPITAL_BASED_OUTPATIENT_CLINIC_OR_DEPARTMENT_OTHER)
Admission: RE | Admit: 2023-08-01 | Discharge: 2023-08-01 | Disposition: A | Discharge status: Home / Self Care | Payer: No Typology Code available for payment source | Attending: Obstetrics and Gynecology | Admitting: Obstetrics and Gynecology

## 2023-08-01 DIAGNOSIS — R102 Pelvic and perineal pain: Secondary | ICD-10-CM | POA: Diagnosis present

## 2023-08-01 DIAGNOSIS — Z01818 Encounter for other preprocedural examination: Secondary | ICD-10-CM

## 2023-08-01 DIAGNOSIS — D259 Leiomyoma of uterus, unspecified: Secondary | ICD-10-CM | POA: Diagnosis not present

## 2023-08-01 DIAGNOSIS — D251 Intramural leiomyoma of uterus: Secondary | ICD-10-CM | POA: Insufficient documentation

## 2023-08-01 HISTORY — PX: ROBOTIC ASSISTED TOTAL HYSTERECTOMY WITH BILATERAL SALPINGO OOPHERECTOMY: SHX6086

## 2023-08-01 HISTORY — DX: Presence of spectacles and contact lenses: Z97.3

## 2023-08-01 HISTORY — PX: CYSTOSCOPY: SHX5120

## 2023-08-01 HISTORY — DX: Hypotension, unspecified: I95.9

## 2023-08-01 LAB — TYPE AND SCREEN
ABO/RH(D): A POS
ABO/RH(D): A POS
Antibody Screen: NEGATIVE
Antibody Screen: NEGATIVE

## 2023-08-01 SURGERY — HYSTERECTOMY, TOTAL, ROBOT-ASSISTED, LAPAROSCOPIC, WITH BILATERAL SALPINGO-OOPHORECTOMY
Anesthesia: General | Site: Urethra | Laterality: Bilateral

## 2023-08-01 MED ORDER — HYDROMORPHONE HCL 1 MG/ML IJ SOLN: 0.2500 mg | INTRAMUSCULAR | Status: DC | PRN

## 2023-08-01 MED ORDER — LIDOCAINE 2% (20 MG/ML) 5 ML SYRINGE
INTRAMUSCULAR | Status: DC | PRN
  Administered 2023-08-01: 60 mg via INTRAVENOUS

## 2023-08-01 MED ORDER — SCOPOLAMINE 1 MG/3DAYS TD PT72: 1.0000 | MEDICATED_PATCH | TRANSDERMAL | Status: DC

## 2023-08-01 MED ORDER — FENTANYL CITRATE (PF) 100 MCG/2ML IJ SOLN
INTRAMUSCULAR | Status: AC
  Filled 2023-08-01: qty 2

## 2023-08-01 MED ORDER — POVIDONE-IODINE 10 % EX SWAB: 2.0000 | Freq: Once | CUTANEOUS | Status: DC

## 2023-08-01 MED ORDER — HYDROMORPHONE HCL 2 MG/ML IJ SOLN
INTRAMUSCULAR | Status: AC
  Filled 2023-08-01: qty 1

## 2023-08-01 MED ORDER — MIDAZOLAM HCL 5 MG/5ML IJ SOLN
INTRAMUSCULAR | Status: DC | PRN
  Administered 2023-08-01: 2 mg via INTRAVENOUS

## 2023-08-01 MED ORDER — EPHEDRINE 5 MG/ML INJ
INTRAVENOUS | Status: AC
  Filled 2023-08-01: qty 5

## 2023-08-01 MED ORDER — BUPIVACAINE HCL (PF) 0.5 % IJ SOLN
INTRAMUSCULAR | Status: DC | PRN
  Administered 2023-08-01: 15 mL

## 2023-08-01 MED ORDER — LIDOCAINE HCL (PF) 2 % IJ SOLN
INTRAMUSCULAR | Status: AC
  Filled 2023-08-01: qty 20

## 2023-08-01 MED ORDER — SODIUM CHLORIDE 0.9 % IV SOLN
2.0000 g | INTRAVENOUS | Status: AC
  Administered 2023-08-01: 2 g via INTRAVENOUS

## 2023-08-01 MED ORDER — STERILE WATER FOR IRRIGATION IR SOLN
Status: DC | PRN
  Administered 2023-08-01: 500 mL

## 2023-08-01 MED ORDER — GLYCOPYRROLATE PF 0.2 MG/ML IJ SOSY
PREFILLED_SYRINGE | INTRAMUSCULAR | Status: DC | PRN
Start: 2023-08-01 — End: 2023-08-01
  Administered 2023-08-01: .2 mg via INTRAVENOUS

## 2023-08-01 MED ORDER — GABAPENTIN 300 MG PO CAPS
300.0000 mg | ORAL_CAPSULE | ORAL | Status: AC
  Administered 2023-08-01: 300 mg via ORAL

## 2023-08-01 MED ORDER — ACETAMINOPHEN 500 MG PO TABS
ORAL_TABLET | ORAL | Status: AC
  Filled 2023-08-01: qty 2

## 2023-08-01 MED ORDER — SUGAMMADEX SODIUM 200 MG/2ML IV SOLN
INTRAVENOUS | Status: DC | PRN
  Administered 2023-08-01: 200 mg via INTRAVENOUS

## 2023-08-01 MED ORDER — MIDAZOLAM HCL 2 MG/2ML IJ SOLN
INTRAMUSCULAR | Status: AC
  Filled 2023-08-01: qty 2

## 2023-08-01 MED ORDER — KETOROLAC TROMETHAMINE 30 MG/ML IJ SOLN
INTRAMUSCULAR | Status: AC
  Filled 2023-08-01: qty 1

## 2023-08-01 MED ORDER — ONDANSETRON HCL 4 MG/2ML IJ SOLN
INTRAMUSCULAR | Status: AC
  Filled 2023-08-01: qty 2

## 2023-08-01 MED ORDER — OXYCODONE HCL 5 MG PO TABS: 5.0000 mg | ORAL_TABLET | Freq: Once | ORAL | Status: DC | PRN

## 2023-08-01 MED ORDER — PHENYLEPHRINE 80 MCG/ML (10ML) SYRINGE FOR IV PUSH (FOR BLOOD PRESSURE SUPPORT)
PREFILLED_SYRINGE | INTRAVENOUS | Status: DC | PRN
  Administered 2023-08-01: 160 ug via INTRAVENOUS

## 2023-08-01 MED ORDER — OXYCODONE HCL 5 MG/5ML PO SOLN: 5.0000 mg | Freq: Once | ORAL | Status: DC | PRN

## 2023-08-01 MED ORDER — LACTATED RINGERS IV SOLN: INTRAVENOUS | Status: AC

## 2023-08-01 MED ORDER — LACTATED RINGERS IV SOLN: INTRAVENOUS | Status: DC

## 2023-08-01 MED ORDER — PROPOFOL 10 MG/ML IV BOLUS
INTRAVENOUS | Status: AC
  Filled 2023-08-01: qty 20

## 2023-08-01 MED ORDER — SODIUM CHLORIDE 0.9 % IV SOLN
Freq: Once | INTRAVENOUS | Status: AC
  Administered 2023-08-01: 400 mL
  Filled 2023-08-01: qty 10

## 2023-08-01 MED ORDER — DROPERIDOL 2.5 MG/ML IJ SOLN
0.6250 mg | Freq: Once | INTRAMUSCULAR | Status: AC | PRN
  Administered 2023-08-01: 0.625 mg via INTRAVENOUS

## 2023-08-01 MED ORDER — GABAPENTIN 300 MG PO CAPS
ORAL_CAPSULE | ORAL | Status: AC
  Filled 2023-08-01: qty 1

## 2023-08-01 MED ORDER — DEXAMETHASONE SODIUM PHOSPHATE 10 MG/ML IJ SOLN
INTRAMUSCULAR | Status: DC | PRN
  Administered 2023-08-01: 10 mg via INTRAVENOUS

## 2023-08-01 MED ORDER — ONDANSETRON HCL 4 MG/2ML IJ SOLN
INTRAMUSCULAR | Status: DC | PRN
  Administered 2023-08-01: 4 mg via INTRAVENOUS

## 2023-08-01 MED ORDER — EPHEDRINE SULFATE-NACL 50-0.9 MG/10ML-% IV SOSY
PREFILLED_SYRINGE | INTRAVENOUS | Status: DC | PRN
Start: 2023-08-01 — End: 2023-08-01
  Administered 2023-08-01: 5 mg via INTRAVENOUS

## 2023-08-01 MED ORDER — DEXAMETHASONE SODIUM PHOSPHATE 10 MG/ML IJ SOLN
INTRAMUSCULAR | Status: AC
  Filled 2023-08-01: qty 1

## 2023-08-01 MED ORDER — ACETAMINOPHEN 500 MG PO TABS
1000.0000 mg | ORAL_TABLET | ORAL | Status: AC
  Administered 2023-08-01: 1000 mg via ORAL

## 2023-08-01 MED ORDER — CEFOXITIN SODIUM 2 G IV SOLR
INTRAVENOUS | Status: AC
  Filled 2023-08-01: qty 2

## 2023-08-01 MED ORDER — KETOROLAC TROMETHAMINE 30 MG/ML IJ SOLN
INTRAMUSCULAR | Status: DC | PRN
  Administered 2023-08-01: 30 mg via INTRAVENOUS

## 2023-08-01 MED ORDER — MEPERIDINE HCL 25 MG/ML IJ SOLN: 6.2500 mg | INTRAMUSCULAR | Status: DC | PRN

## 2023-08-01 MED ORDER — PROPOFOL 10 MG/ML IV BOLUS
INTRAVENOUS | Status: DC | PRN
  Administered 2023-08-01: 160 mg via INTRAVENOUS

## 2023-08-01 MED ORDER — DROPERIDOL 2.5 MG/ML IJ SOLN
INTRAMUSCULAR | Status: AC
  Filled 2023-08-01: qty 2

## 2023-08-01 MED ORDER — ROCURONIUM BROMIDE 10 MG/ML (PF) SYRINGE
PREFILLED_SYRINGE | INTRAVENOUS | Status: DC | PRN
  Administered 2023-08-01: 20 mg via INTRAVENOUS
  Administered 2023-08-01: 60 mg via INTRAVENOUS

## 2023-08-01 MED ORDER — PHENYLEPHRINE 80 MCG/ML (10ML) SYRINGE FOR IV PUSH (FOR BLOOD PRESSURE SUPPORT)
PREFILLED_SYRINGE | INTRAVENOUS | Status: AC
  Filled 2023-08-01: qty 10

## 2023-08-01 MED ORDER — ROCURONIUM BROMIDE 10 MG/ML (PF) SYRINGE
PREFILLED_SYRINGE | INTRAVENOUS | Status: AC
  Filled 2023-08-01: qty 10

## 2023-08-01 MED ORDER — GLYCOPYRROLATE PF 0.2 MG/ML IJ SOSY
PREFILLED_SYRINGE | INTRAMUSCULAR | Status: AC
  Filled 2023-08-01: qty 1

## 2023-08-01 MED ORDER — FENTANYL CITRATE (PF) 100 MCG/2ML IJ SOLN
INTRAMUSCULAR | Status: DC | PRN
  Administered 2023-08-01: 50 ug via INTRAVENOUS
  Administered 2023-08-01: 25 ug via INTRAVENOUS
  Administered 2023-08-01: 50 ug via INTRAVENOUS

## 2023-08-01 MED ORDER — HYDROMORPHONE HCL 1 MG/ML IJ SOLN
INTRAMUSCULAR | Status: DC | PRN
Start: 2023-08-01 — End: 2023-08-01
  Administered 2023-08-01 (×2): .5 mg via INTRAVENOUS

## 2023-08-01 SURGICAL SUPPLY — 50 items
ADH SKN CLS APL DERMABOND .7 (GAUZE/BANDAGES/DRESSINGS) ×2
CATH FOLEY 3WAY 5CC 16FR (CATHETERS) ×2 IMPLANT
COVER BACK TABLE 60X90IN (DRAPES) ×2 IMPLANT
COVER TIP SHEARS 8 DVNC (MISCELLANEOUS) ×2 IMPLANT
DEFOGGER SCOPE WARMER CLEARIFY (MISCELLANEOUS) ×2 IMPLANT
DERMABOND ADVANCED .7 DNX12 (GAUZE/BANDAGES/DRESSINGS) ×2 IMPLANT
DRAPE ARM DVNC X/XI (DISPOSABLE) ×8 IMPLANT
DRAPE COLUMN DVNC XI (DISPOSABLE) ×2 IMPLANT
DRAPE UTILITY XL STRL (DRAPES) ×2 IMPLANT
DRIVER NDL MEGA SUTCUT DVNCXI (INSTRUMENTS) IMPLANT
DRIVER NDLE MEGA SUTCUT DVNCXI (INSTRUMENTS) ×2 IMPLANT
DURAPREP 26ML APPLICATOR (WOUND CARE) ×2 IMPLANT
ELECT REM PT RETURN 9FT ADLT (ELECTROSURGICAL) ×2
ELECTRODE REM PT RTRN 9FT ADLT (ELECTROSURGICAL) ×2 IMPLANT
FORCEPS PROGRASP DVNC XI (FORCEP) IMPLANT
GAUZE 4X4 16PLY ~~LOC~~+RFID DBL (SPONGE) IMPLANT
GLOVE BIO SURGEON STRL SZ 6 (GLOVE) IMPLANT
GLOVE BIO SURGEON STRL SZ7 (GLOVE) IMPLANT
GLOVE BIOGEL PI IND STRL 6 (GLOVE) IMPLANT
GLOVE BIOGEL PI IND STRL 7.0 (GLOVE) IMPLANT
GLOVE NEODERM STER SZ 7 (GLOVE) ×6 IMPLANT
GLOVE SURG SS PI 7.0 STRL IVOR (GLOVE) IMPLANT
GLOVE SURG SS PI 7.5 STRL IVOR (GLOVE) IMPLANT
GYRUS RUMI II 2.5CM BLUE (DISPOSABLE) ×2
HIBICLENS CHG 4% 4OZ (MISCELLANEOUS) IMPLANT
IRRIG SUCT STRYKERFLOW 2 WTIP (MISCELLANEOUS) ×2
IRRIGATION SUCT STRKRFLW 2 WTP (MISCELLANEOUS) ×2 IMPLANT
KIT PINK PAD W/HEAD ARE REST (MISCELLANEOUS) ×2
KIT PINK PAD W/HEAD ARM REST (MISCELLANEOUS) ×2 IMPLANT
KIT TURNOVER CYSTO (KITS) ×2 IMPLANT
LEGGING LITHOTOMY PAIR STRL (DRAPES) ×2 IMPLANT
MANIFOLD NEPTUNE II (INSTRUMENTS) ×2 IMPLANT
OBTURATOR OPTICAL STND 8 DVNC (TROCAR) ×2
OBTURATOR OPTICALSTD 8 DVNC (TROCAR) ×2 IMPLANT
PACK ROBOT WH (CUSTOM PROCEDURE TRAY) ×2 IMPLANT
PACK ROBOTIC GOWN (GOWN DISPOSABLE) ×2 IMPLANT
PAD OB MATERNITY 4.3X12.25 (PERSONAL CARE ITEMS) ×2 IMPLANT
RUMI II GYRUS 2.5CM BLUE (DISPOSABLE) IMPLANT
SCISSORS MNPLR CVD DVNC XI (INSTRUMENTS) IMPLANT
SEAL UNIV 5-12 XI (MISCELLANEOUS) ×6 IMPLANT
SEALER VESSEL EXT DVNC XI (MISCELLANEOUS) IMPLANT
SET IRRIG Y TYPE TUR BLADDER L (SET/KITS/TRAYS/PACK) ×2 IMPLANT
SET TUBE SMOKE EVAC HIGH FLOW (TUBING) ×2 IMPLANT
SPIKE FLUID TRANSFER (MISCELLANEOUS) ×2 IMPLANT
SUT MNCRL AB 4-0 PS2 18 (SUTURE) ×2 IMPLANT
SUT VLOC 180 0 9IN GS21 (SUTURE) ×2 IMPLANT
TIP UTERINE 6.7X10CM GRN DISP (MISCELLANEOUS) IMPLANT
TOWEL OR 17X24 6PK STRL BLUE (TOWEL DISPOSABLE) ×2 IMPLANT
UNDERPAD 30X36 HEAVY ABSORB (UNDERPADS AND DIAPERS) ×2 IMPLANT
WATER STERILE IRR 500ML POUR (IV SOLUTION) ×2 IMPLANT

## 2023-08-01 NOTE — Anesthesia Preprocedure Evaluation (Addendum)
Anesthesia Evaluation    Reviewed: Allergy & Precautions, Patient's Chart, lab work & pertinent test results, Unable to perform ROS - Chart review only  Airway Mallampati: I  TM Distance: >3 FB Neck ROM: Full    Dental no notable dental hx. (+) Dental Advisory Given, Teeth Intact   Pulmonary neg pulmonary ROS   Pulmonary exam normal breath sounds clear to auscultation       Cardiovascular negative cardio ROS Normal cardiovascular exam Rhythm:Regular Rate:Normal     Neuro/Psych  Headaches, Seizures -, Well Controlled,     GI/Hepatic negative GI ROS, Neg liver ROS,,,  Endo/Other  negative endocrine ROS    Renal/GU negative Renal ROS     Musculoskeletal negative musculoskeletal ROS (+)    Abdominal   Peds  Hematology negative hematology ROS (+)   Anesthesia Other Findings   Reproductive/Obstetrics                              Anesthesia Physical Anesthesia Plan  ASA: 2  Anesthesia Plan: General   Post-op Pain Management: Tylenol PO (pre-op)* and Gabapentin PO (pre-op)*   Induction: Intravenous  PONV Risk Score and Plan: 4 or greater and Scopolamine patch - Pre-op, Ondansetron, Dexamethasone, Midazolam and Treatment may vary due to age or medical condition  Airway Management Planned: Oral ETT  Additional Equipment:   Intra-op Plan:   Post-operative Plan: Extubation in OR  Informed Consent: I have reviewed the patients History and Physical, chart, labs and discussed the procedure including the risks, benefits and alternatives for the proposed anesthesia with the patient or authorized representative who has indicated his/her understanding and acceptance.     Dental advisory given  Plan Discussed with: CRNA  Anesthesia Plan Comments: (2 x PIV)         Anesthesia Quick Evaluation

## 2023-08-01 NOTE — Interval H&P Note (Signed)
History and Physical Interval Note:  08/01/2023 11:28 AM  Jamie Hale  has presented today for surgery, with the diagnosis of fibroids, RLQ pain.  The various methods of treatment have been discussed with the patient and family. After consideration of risks, benefits and other options for treatment, the patient has consented to  Procedure(s): XI ROBOTIC ASSISTED TOTAL HYSTERECTOMY WITH BILATERAL SALPINGO OOPHORECTOMY (Bilateral) CYSTOSCOPY (Bilateral) as a surgical intervention.  The patient's history has been reviewed, patient examined, no change in status, stable for surgery.  I have reviewed the patient's chart and labs.  Questions were answered to the patient's satisfaction.     Earley Favor

## 2023-08-01 NOTE — Discharge Instructions (Addendum)
  Post Anesthesia Home Care Instructions  Activity: Get plenty of rest for the remainder of the day. A responsible individual must stay with you for 24 hours following the procedure.  For the next 24 hours, DO NOT: -Drive a car -Advertising copywriter -Drink alcoholic beverages -Take any medication unless instructed by your physician -Make any legal decisions or sign important papers.  Meals: Start with liquid foods such as gelatin or soup. Progress to regular foods as tolerated. Avoid greasy, spicy, heavy foods. If nausea and/or vomiting occur, drink only clear liquids until the nausea and/or vomiting subsides. Call your physician if vomiting continues.  Special Instructions/Symptoms: Your throat may feel dry or sore from the anesthesia or the breathing tube placed in your throat during surgery. If this causes discomfort, gargle with warm salt water. The discomfort should disappear within 24 hours.  If you had a scopolamine patch placed behind your ear for the management of post- operative nausea and/or vomiting:  1. The medication in the patch is effective for 72 hours, after which it should be removed.  Wrap patch in a tissue and discard in the trash. Wash hands thoroughly with soap and water. 2. You may remove the patch earlier than 72 hours if you experience unpleasant side effects which may include dry mouth, dizziness or visual disturbances. 3. Avoid touching the patch. Wash your hands with soap and water after contact with the patch.  You received 1,000 mg of Tylenol today at approximately 8:35, next available dose can be taken after 2:30 pm. Total daily intake of Tylenol should not exceed 4,000 mg

## 2023-08-01 NOTE — Anesthesia Procedure Notes (Signed)
Procedure Name: Intubation Date/Time: 08/01/2023 11:54 AM  Performed by: Bishop Limbo, CRNAPre-anesthesia Checklist: Patient identified, Emergency Drugs available, Suction available and Patient being monitored Patient Re-evaluated:Patient Re-evaluated prior to induction Oxygen Delivery Method: Circle System Utilized Preoxygenation: Pre-oxygenation with 100% oxygen Induction Type: IV induction Ventilation: Mask ventilation without difficulty Laryngoscope Size: Mac and 3 Grade View: Grade I Tube type: Oral Tube size: 7.0 mm Number of attempts: 1 Airway Equipment and Method: Stylet Placement Confirmation: ETT inserted through vocal cords under direct vision, positive ETCO2 and breath sounds checked- equal and bilateral Secured at: 22 cm Tube secured with: Tape Dental Injury: Teeth and Oropharynx as per pre-operative assessment

## 2023-08-01 NOTE — Transfer of Care (Signed)
Immediate Anesthesia Transfer of Care Note  Patient: Jamie Hale  Procedure(s) Performed: XI ROBOTIC ASSISTED TOTAL HYSTERECTOMY WITH BILATERAL SALPINGO OOPHORECTOMY (Bilateral: Abdomen) CYSTOSCOPY (Bilateral: Urethra)  Patient Location: PACU  Anesthesia Type:General  Level of Consciousness: awake, alert , oriented, and patient cooperative  Airway & Oxygen Therapy: Patient Spontanous Breathing  Post-op Assessment: Report given to RN and Post -op Vital signs reviewed and stable  Post vital signs: Reviewed and stable  Last Vitals:  Vitals Value Taken Time  BP 134/84 08/01/23 1315  Temp    Pulse 79 08/01/23 1317  Resp 11 08/01/23 1317  SpO2 96 % 08/01/23 1317  Vitals shown include unfiled device data.  Last Pain:  Vitals:   08/01/23 0852  TempSrc: Oral  PainSc: 0-No pain      Patients Stated Pain Goal: 4 (08/01/23 6213)  Complications: No notable events documented.

## 2023-08-02 ENCOUNTER — Telehealth: Payer: Self-pay | Admitting: Obstetrics and Gynecology

## 2023-08-02 NOTE — Telephone Encounter (Signed)
Patient called for surgery follow-up.  Reports she is doing well. Voiding and ambulating and tolerating diet. Spotting yesterday and nothing today Dr. Karma Greaser

## 2023-08-02 NOTE — Anesthesia Postprocedure Evaluation (Signed)
Anesthesia Post Note  Patient: Jamie Hale  Procedure(s) Performed: XI ROBOTIC ASSISTED TOTAL HYSTERECTOMY WITH BILATERAL SALPINGO OOPHORECTOMY (Bilateral: Abdomen) CYSTOSCOPY (Bilateral: Urethra)     Patient location during evaluation: PACU Anesthesia Type: General Level of consciousness: sedated and patient cooperative Pain management: pain level controlled Vital Signs Assessment: post-procedure vital signs reviewed and stable Respiratory status: spontaneous breathing Cardiovascular status: stable Anesthetic complications: no   No notable events documented.  Last Vitals:  Vitals:   08/01/23 1715 08/01/23 1730  BP: 127/84 136/86  Pulse: 64 64  Resp:    Temp:  36.4 C  SpO2: 96% 97%    Last Pain:  Vitals:   08/01/23 1730  TempSrc:   PainSc: 0-No pain                 Lewie Loron

## 2023-08-03 ENCOUNTER — Encounter (HOSPITAL_BASED_OUTPATIENT_CLINIC_OR_DEPARTMENT_OTHER): Payer: Self-pay | Admitting: Obstetrics and Gynecology

## 2023-08-03 LAB — SURGICAL PATHOLOGY

## 2023-08-04 NOTE — Op Note (Signed)
08/04/2023  [id number] Jamie Hale        OPERATIVE REPORT   Preop Diagnosis: pelvic pain, symptomatic fibroids  Procedure: robotic hysterectomy, BSO, cystoscopy   Surgeon: Dr. Allayne Butcher Assistant: Donne Hazel, RN   Fluids: please see anesthesia report   Complications: None Anesthesia: General     Findings:  boggy 10cm uterus, normal ovaries and tubes Cystoscopy at the end of the case with normal bladder and patent ureters bilaterally.   Estimated blood loss: Minimal <25cc   Specimens: Uterus, cervix and bilateral tubes and ovaries   Disposition of specimen: Pathology           Patient is taken to the operating room. She is placed in the supine position. She is a running IV in place. Informed consent was present on the chart. SCDs on her lower extremities and functioning properly. Patient was positioned while she was awake.  Her legs were placed in the low lithotomy position in [place] stirrups. Her arms were tucked by the side.  General endotracheal anesthesia was administered by the anesthesia staff without difficulty.       Chlora prep was then used to prep the abdomen and Hibiclens was used to prep the inner thighs, perineum and vagina. Once 3 minutes had past the patient was draped in a normal standard fashion. A proper time out was performed and everyone agreed.  The legs were lifted to the high lithotomy position. A bivalve speculum was inserted into the vagina and the anterior lip of the cervix was grasped with single-tooth tenaculum.  The uterus sounded to  10 cm. Pratt dilators were used to dilate the cervix.  The RUMI uterine manipulator was obtained inserted into the endometrial cavity and the bulb of the disposable tip was inflated with 8 cc of normal saline. There was a good fit of the KOH ring around the cervix. The tenaculum and bivavle speculum was removed. There is also good manipulation of the uterus.  A Foley catheter was placed to  straight drain.  Clear urine was noted. Legs were lowered to the low lithotomy position and attention was turned the abdomen.   Superior to the umbilicus, marcaine 0.25% used to anesthetize the skin.  Using #11 blade, 8mm skin incision was made.  The 8mm robotic trocar and sleeve was inserted under direct visualization.  CO2 gas was  started and patient was placed in trendelenburg position.  Two additional 8mm ports were placed under direct visualization in the left and right lower quadrant.     Ureters were identifies.  Attention was turned to the left side.  The left IP ligament was noted away from the ureters and this was desiccated and transected with the vessel sealer.  The uterine ovarian pedicle was serially clamped cauterized and incised. Left round ligament was serially clamped cauterized and incised. The anterior and posterior peritoneum of the inferior leaf of the broad ligament were opened. The beginning of the bladder flap was created.  The bladder was taken down below the level of the KOH ring. The left uterine artery skeletonized and then just superior to the KOH ring this vessel was serially clamped, cauterized, and incised.   Attention was turned the right side.  The uterus was placed on stretch to the opposite side.    The right IP ligament was away from the ureter and this was desiccated and transected with the vessel sealer.  Then the right uterine ovarian pedicle was serially clamped cauterized and incised. Next the  right round ligament was serially clamped cauterized and incised. The anterior posterior peritoneum of the inferiorly for the broad ligament were opened. The anterior peritoneum was carried across to the dissection on the left side. The remainder of the bladder flap was created using sharp dissection. The bladder was well below the level of the KOH ring. The right uterine artery skeletonized. Then the right uterine artery, above the level of the KOH ring, was serially clamped  cauterized and incised. The uterus was devascularized at this point.   The colpotomy was performed.  This was carried around a circumferential fashion until the vaginal mucosa was completely incised in the specimen was freed.  The specimen was then delivered to the vagina.  A vaginal occlusive device was used to maintain the pneumoperitoneum   Instruments were changed with a needle driver and prograsp.  Using a 9 inch  zero V-lock suture, the cuff was closed by incorporating the anterior and posterior vaginal mucosa in each stitch. This was carried across all the way to the left corner and a running fashion. Two stitches were brought back towards the midline and the suture was cut flush with the vagina. The needle was brought out the pelvis. The pelvis was irrigated. All pedicles were inspected. No bleeding was noted.   CO2 pressures were lowered to 8mm Hg.  Again, no bleeding was noted.  Ureters were noted deep in the pelvis to be peristalsing.  At this point the procedure was completed.  The remaining instruments were removed.  The ports were removed under direct visualization of the laparoscope and the pneumoperitoneum was relieved.   The skin was then closed with subcuticular stitches of 3-0 Vicryl. The skin was cleansed Dermabond was applied. Attention was then turned the vagina and the cuff was inspected. No bleeding was noted.  The Foley catheter was removed.  Cystoscopy was performed.  No sutures or bladder injuries were noted.  Ureters were noted with normal urine jets from each one was seen.  Foley was left out after the cystoscopic fluid was drained and cystoscope removed.  Sponge, lap, needle, instrument counts were correct x2. Patient tolerated the procedure very well. She was awakened from anesthesia, extubated and taken to recovery in stable condition.      Dr. Karma Greaser

## 2023-08-05 DIAGNOSIS — Z0289 Encounter for other administrative examinations: Secondary | ICD-10-CM

## 2023-08-15 ENCOUNTER — Ambulatory Visit (INDEPENDENT_AMBULATORY_CARE_PROVIDER_SITE_OTHER): Payer: No Typology Code available for payment source | Admitting: Obstetrics and Gynecology

## 2023-08-15 ENCOUNTER — Encounter: Payer: Self-pay | Admitting: Obstetrics and Gynecology

## 2023-08-15 VITALS — BP 112/70 | HR 68

## 2023-08-15 DIAGNOSIS — Z9889 Other specified postprocedural states: Secondary | ICD-10-CM

## 2023-08-15 NOTE — Progress Notes (Signed)
Patient presents for 2 week postop from [facility], bilateral salpingectomy, cystoscopy. She is doing well. No fevers, VB, dysuria or severe abdominal pain.  Blood pressure 104/70, pulse 72, weight 184 lb (83.5 kg), last menstrual period 07/20/2023, SpO2 100%.  Abdomen: incisions I/c/d, NT, ND  A/p PO from [facility] 2 weeks doing well Encouraged no heavy lifting, pushing, pulling greater than 10 lbs for full 8 weeks 2. Pelvic rest for the entire 10 wks 3. RTC with any concerns or with heavy bleeding, fevers or severe abdominal pain.  Dr. Karma Greaser

## 2023-10-06 ENCOUNTER — Ambulatory Visit: Payer: No Typology Code available for payment source | Admitting: Obstetrics and Gynecology

## 2023-10-06 ENCOUNTER — Encounter: Payer: Self-pay | Admitting: Obstetrics and Gynecology

## 2023-10-06 VITALS — BP 118/80 | HR 78

## 2023-10-06 DIAGNOSIS — Z9889 Other specified postprocedural states: Secondary | ICD-10-CM

## 2023-10-06 DIAGNOSIS — M8580 Other specified disorders of bone density and structure, unspecified site: Secondary | ICD-10-CM

## 2023-10-06 MED ORDER — INTRAROSA 6.5 MG VA INST: 1.0000 | VAGINAL_INSERT | Freq: Every evening | VAGINAL | 12 refills | Status: DC | PRN

## 2023-10-06 NOTE — Progress Notes (Signed)
Patient presents for 10 week postop from [facility], bilateral salpingectomy, cystoscopy. She is doing well. No fevers, VB, dysuria or severe abdominal pain.  Estrace cream caused irritation and inflammation and she stopped  LMP 04/17/2020   SVE: sutures seen and dissolving, normal discharge, no bleeding  A/p PO from [facility] 10 weeks doing well Resume activities 2. Pelvic rest for additional 1-2 wks 3. Encouraged annual mammograms and resume annual care with PCP 4.  Intrarosa sent to use in place of estrace  Dr. Karma Greaser

## 2024-01-04 IMAGING — MG MM DIGITAL SCREENING BILAT W/ TOMO AND CAD
8 series · 9 of 24 positions shown · non-contrast
Comparison: Previous exam(s).

CLINICAL DATA: Screening.

EXAM:
DIGITAL SCREENING BILATERAL MAMMOGRAM WITH TOMOSYNTHESIS AND CAD
TECHNIQUE: Bilateral screening digital craniocaudal and mediolateral oblique
mammograms were obtained. Bilateral screening digital breast
tomosynthesis was performed. The images were evaluated with
computer-aided detection.

[L MLO synth-2D]
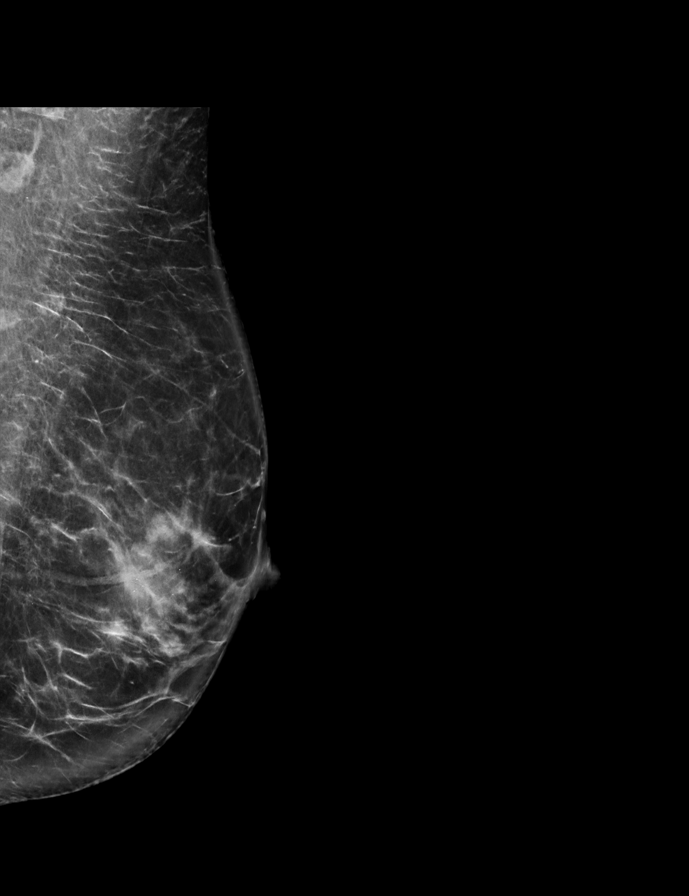

[R MLO synth-2D]
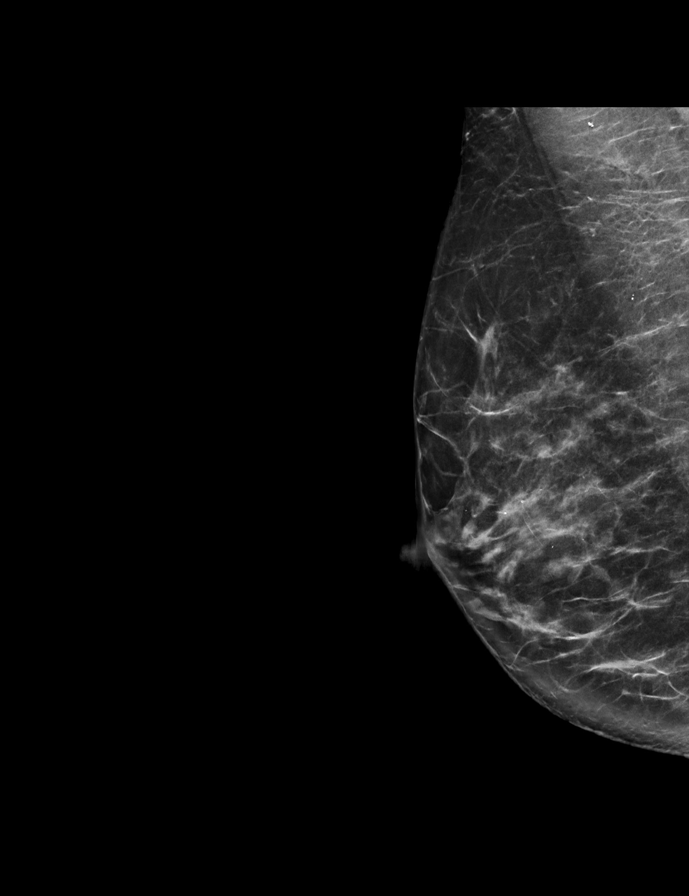

[L CC synth-2D]
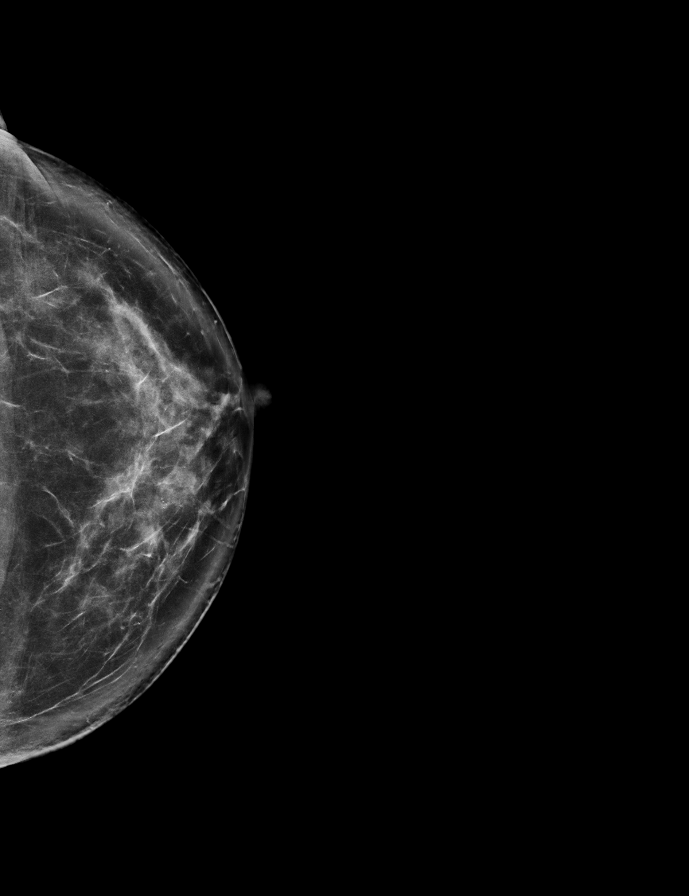

[R CC synth-2D]
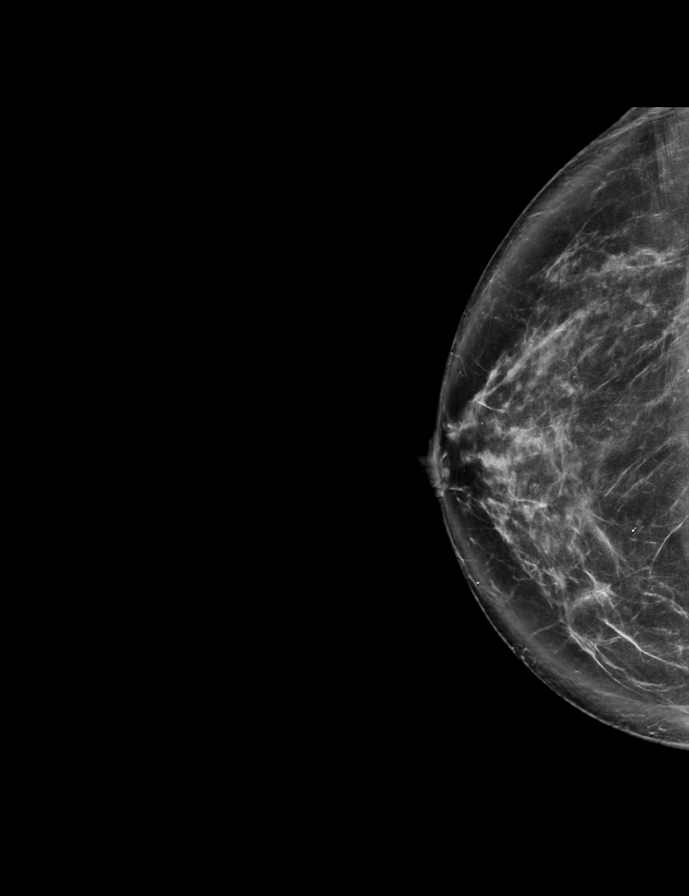

[L CC tomo · 2 of 80 frames shown]
[frame 26/80]
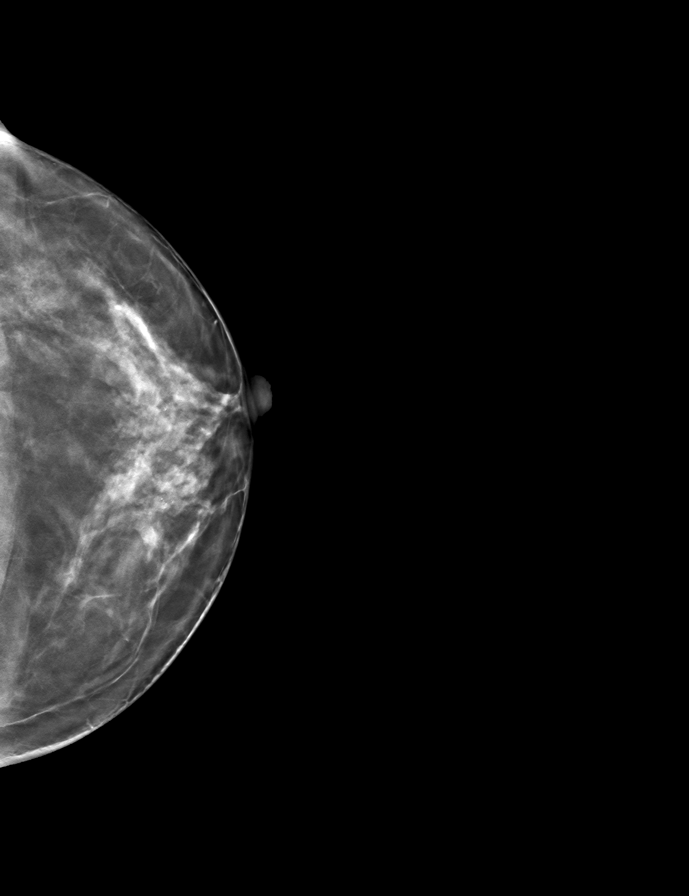
[frame 41/80]
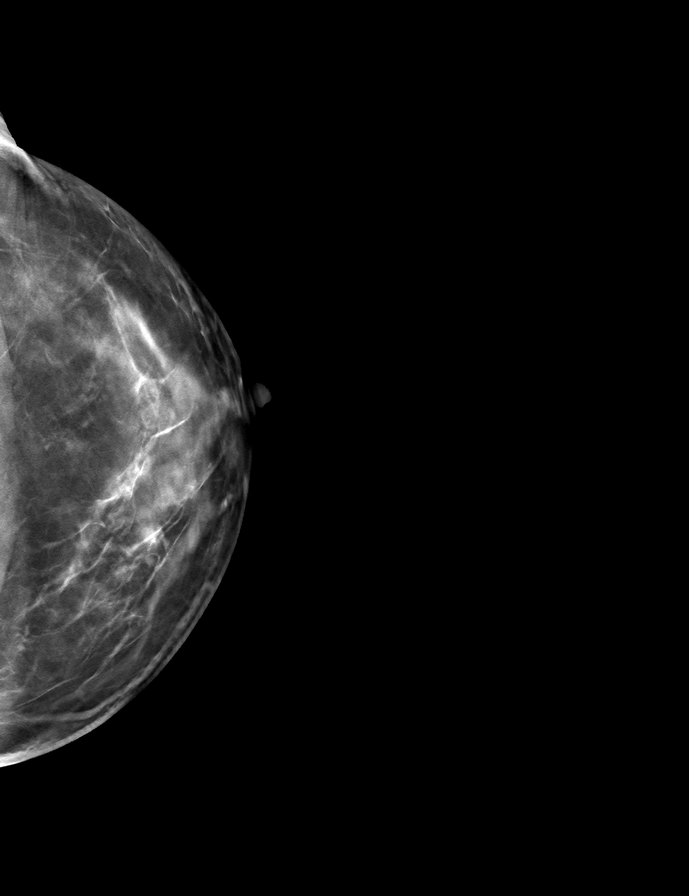

[R MLO tomo · tomo slice 37/72.0]
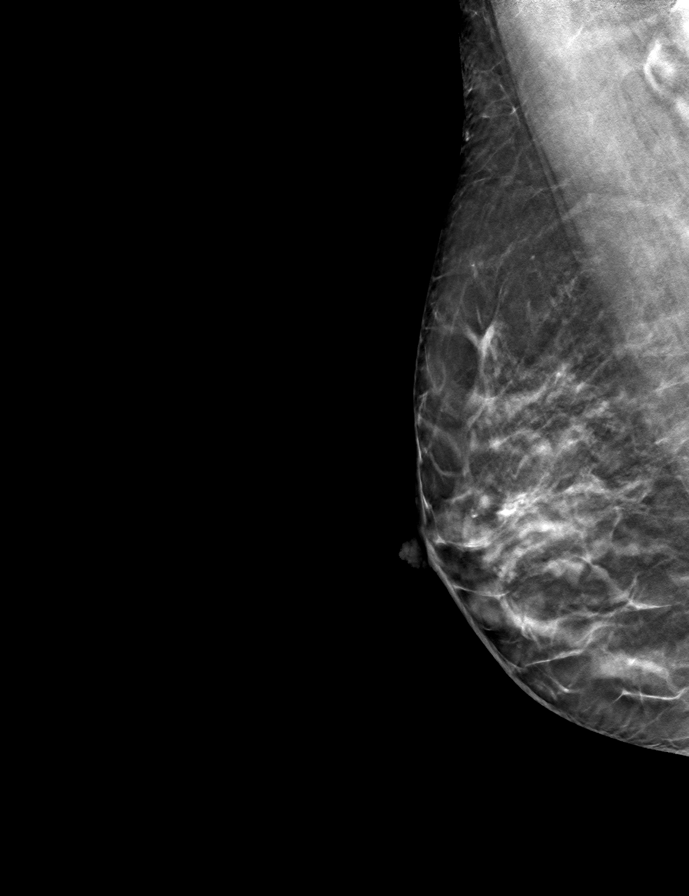

[L MLO tomo · tomo slice 37/74.0]
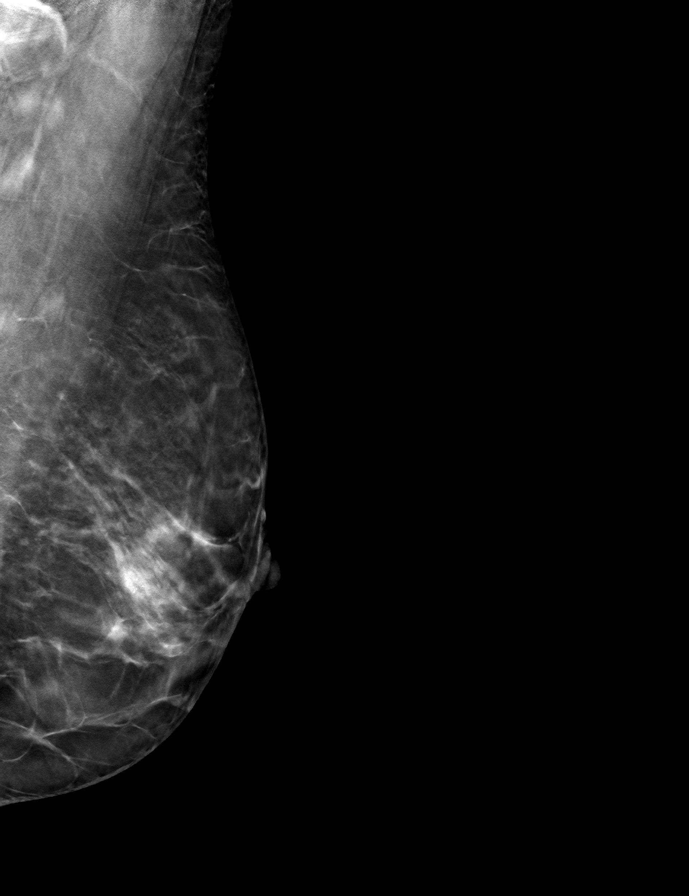

[R CC tomo · tomo slice 40/79.0]
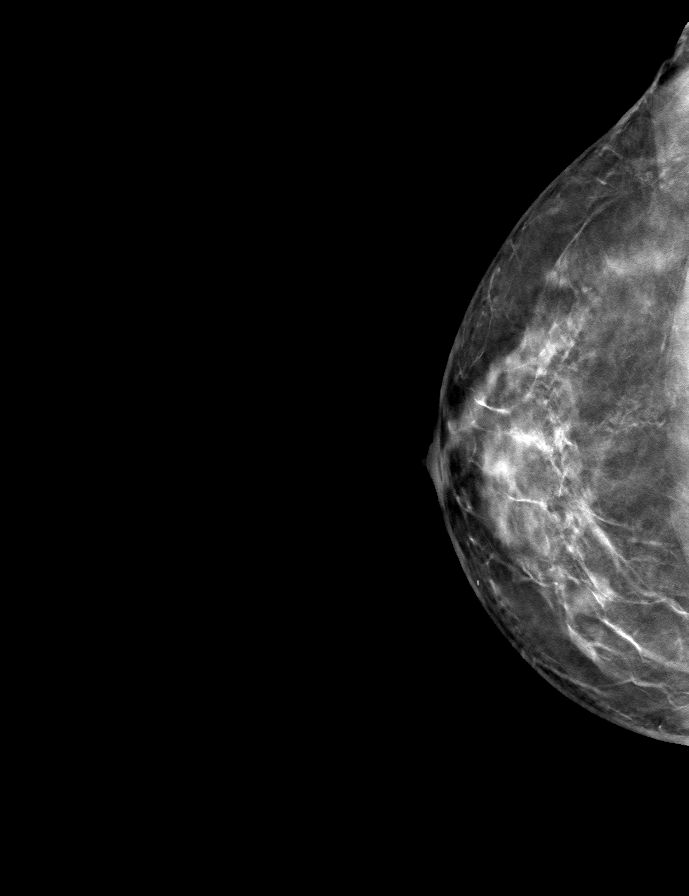

[9 of 24 positions shown; findings below may reference images not displayed]

ACR Breast Density Category c: The breast tissue is heterogeneously
dense, which may obscure small masses.
FINDINGS: There are no findings suspicious for malignancy.
IMPRESSION: No mammographic evidence of malignancy. A result letter of this
screening mammogram will be mailed directly to the patient.

RECOMMENDATION:
Screening mammogram in one year. (Code:[id number])

BI-RADS CATEGORY  1: Negative.

## 2024-01-16 ENCOUNTER — Ambulatory Visit (INDEPENDENT_AMBULATORY_CARE_PROVIDER_SITE_OTHER): Payer: No Typology Code available for payment source | Admitting: Family Medicine

## 2024-01-16 ENCOUNTER — Encounter: Payer: Self-pay | Admitting: Family Medicine

## 2024-01-16 VITALS — BP 110/70 | HR 76 | Temp 98.0°F | Resp 12 | Ht 68.0 in | Wt 164.2 lb

## 2024-01-16 DIAGNOSIS — Z1329 Encounter for screening for other suspected endocrine disorder: Secondary | ICD-10-CM | POA: Diagnosis not present

## 2024-01-16 DIAGNOSIS — Z13220 Encounter for screening for lipoid disorders: Secondary | ICD-10-CM

## 2024-01-16 DIAGNOSIS — E049 Nontoxic goiter, unspecified: Secondary | ICD-10-CM

## 2024-01-16 DIAGNOSIS — Z0000 Encounter for general adult medical examination without abnormal findings: Secondary | ICD-10-CM | POA: Diagnosis not present

## 2024-01-16 DIAGNOSIS — Z1159 Encounter for screening for other viral diseases: Secondary | ICD-10-CM

## 2024-01-16 DIAGNOSIS — Z130 Encounter for screening for diseases of the blood and blood-forming organs and certain disorders involving the immune mechanism: Secondary | ICD-10-CM

## 2024-01-16 DIAGNOSIS — Z13228 Encounter for screening for other metabolic disorders: Secondary | ICD-10-CM

## 2024-01-16 NOTE — Patient Instructions (Addendum)
 A few things to remember from today's visit:  Routine general medical examination at a health care facility  Enlarged thyroid gland - Plan: TSH, US THYROID  Screening for lipoid disorders - Plan: Lipid panel  Screening for endocrine, metabolic and immunity disorder - Plan: Basic metabolic panel with GFR  Encounter for HCV screening test for low risk patient - Plan: Hepatitis C antibody  Do not use My Chart to request refills or for acute issues that need immediate attention. If you send a my chart message, it may take a few days to be addressed, specially if I am not in the office.  Please be sure medication list is accurate. If a new problem present, please set up appointment sooner than planned today.  Health Maintenance, Female Adopting a healthy lifestyle and getting preventive care are important in promoting health and wellness. Ask your health care provider about: The right schedule for you to have regular tests and exams. Things you can do on your own to prevent diseases and keep yourself healthy. What should I know about diet, weight, and exercise? Eat a healthy diet  Eat a diet that includes plenty of vegetables, fruits, low-fat dairy products, and lean protein. Do not eat a lot of foods that are high in solid fats, added sugars, or sodium. Maintain a healthy weight Body mass index (BMI) is used to identify weight problems. It estimates body fat based on height and weight. Your health care provider can help determine your BMI and help you achieve or maintain a healthy weight. Get regular exercise Get regular exercise. This is one of the most important things you can do for your health. Most adults should: Exercise for at least 150 minutes each week. The exercise should increase your heart rate and make you sweat (moderate-intensity exercise). Do strengthening exercises at least twice a week. This is in addition to the moderate-intensity exercise. Spend less time sitting.  Even light physical activity can be beneficial. Watch cholesterol and blood lipids Have your blood tested for lipids and cholesterol at 56 years of age, then have this test every 5 years. Have your cholesterol levels checked more often if: Your lipid or cholesterol levels are high. You are older than 56 years of age. You are at high risk for heart disease. What should I know about cancer screening? Depending on your health history and family history, you may need to have cancer screening at various ages. This may include screening for: Breast cancer. Cervical cancer. Colorectal cancer. Skin cancer. Lung cancer. What should I know about heart disease, diabetes, and high blood pressure? Blood pressure and heart disease High blood pressure causes heart disease and increases the risk of stroke. This is more likely to develop in people who have high blood pressure readings or are overweight. Have your blood pressure checked: Every 3-5 years if you are 43-38 years of age. Every year if you are 59 years old or older. Diabetes Have regular diabetes screenings. This checks your fasting blood sugar level. Have the screening done: Once every three years after age 11 if you are at a normal weight and have a low risk for diabetes. More often and at a younger age if you are overweight or have a high risk for diabetes. What should I know about preventing infection? Hepatitis B If you have a higher risk for hepatitis B, you should be screened for this virus. Talk with your health care provider to find out if you are at risk for hepatitis  B infection. Hepatitis C Testing is recommended for: Everyone born from [birth year] through 1963. Anyone with known risk factors for hepatitis C. Sexually transmitted infections (STIs) Get screened for STIs, including gonorrhea and chlamydia, if: You are sexually active and are younger than 56 years of age. You are older than 56 years of age and your health care provider  tells you that you are at risk for this type of infection. Your sexual activity has changed since you were last screened, and you are at increased risk for chlamydia or gonorrhea. Ask your health care provider if you are at risk. Ask your health care provider about whether you are at high risk for HIV. Your health care provider may recommend a prescription medicine to help prevent HIV infection. If you choose to take medicine to prevent HIV, you should first get tested for HIV. You should then be tested every 3 months for as long as you are taking the medicine. Pregnancy If you are about to stop having your period (premenopausal) and you may become pregnant, seek counseling before you get pregnant. Take 400 to 800 micrograms (mcg) of folic acid every day if you become pregnant. Ask for birth control (contraception) if you want to prevent pregnancy. Osteoporosis and menopause Osteoporosis is a disease in which the bones lose minerals and strength with aging. This can result in bone fractures. If you are 41 years old or older, or if you are at risk for osteoporosis and fractures, ask your health care provider if you should: Be screened for bone loss. Take a calcium or vitamin D supplement to lower your risk of fractures. Be given hormone replacement therapy (HRT) to treat symptoms of menopause. Follow these instructions at home: Alcohol use Do not drink alcohol if: Your health care provider tells you not to drink. You are pregnant, may be pregnant, or are planning to become pregnant. If you drink alcohol: Limit how much you have to: 0-1 drink a day. Know how much alcohol is in your drink. In the [place] one drink equals one 12 oz bottle of beer (355 mL), one 5 oz glass of wine (148 mL), or one 1 oz glass of hard liquor (44 mL). Lifestyle Do not use any products that contain nicotine or tobacco. These products include cigarettes, chewing tobacco, and vaping devices, such as e-cigarettes. If you  need help quitting, ask your health care provider. Do not use street drugs. Do not share needles. Ask your health care provider for help if you need support or information about quitting drugs. General instructions Schedule regular health, dental, and eye exams. Stay current with your vaccines. Tell your health care provider if: You often feel depressed. You have ever been abused or do not feel safe at home. Summary Adopting a healthy lifestyle and getting preventive care are important in promoting health and wellness. Follow your health care provider's instructions about healthy diet, exercising, and getting tested or screened for diseases. Follow your health care provider's instructions on monitoring your cholesterol and blood pressure. This information is not intended to replace advice given to you by your health care provider. Make sure you discuss any questions you have with your health care provider. Document Revised: 02/23/2021 Document Reviewed: 02/23/2021 Elsevier Patient Education  2024 [employer].

## 2024-01-16 NOTE — Progress Notes (Signed)
 HPI: JamieJamie Hale is a 58 y.o. female with a PMHx significant for UTI, seizure, neck pain, and migraine headaches, who is here today to establish care.  Former PCP: not on file Last preventive routine visit: not on file  Exercise: Patient states she walks daily and runs three times per week.  Diet: She eats healthy in general.  Sleep: 7-8 hours per night. Alcohol Use: none Smoking: never Vision: UTD on routine vision care.  Dental: UTD on routine dental care.   Chronic medical problems:   Seizures:  She had a seizure after delivering her son 30 years ago due to eclampsia, but hasn't had any problems since then.   She had a hysterectomy on 08/01/2023.  She takes fish oil and calcium with vitamin D.   Concerns today: none  Review of Systems See other pertinent positives and negatives in HPI.  Current Outpatient Medications on File Prior to Visit  Medication Sig Dispense Refill   BIOTIN PO Take by mouth.     Calcium Citrate-Vitamin D (CALCIUM + D PO) Take by mouth.     Omega-3 Fatty Acids (FISH OIL PO) Take by mouth.     Prasterone (INTRAROSA) 6.5 MG INST Place 1 suppository vaginally at bedtime as needed. 30 each 12   No current facility-administered medications on file prior to visit.   Past Medical History:  Diagnosis Date   Headache    hx of migraines   Hypotension    Patient states that blood pressure sometimes runs in the 90's over 50's.   Osteopenia    Seizure (HCC) 1994   eclamptic, no seizures since   Vaginal Pap smear, abnormal    osteopenia   Wears glasses    Allergies  Allergen Reactions   Gluten Meal Itching   Peanut-Containing Drug Products Itching    Family History  Problem Relation Age of Onset   Breast cancer Mother    Hypertension Mother    Osteoarthritis Mother    Osteoporosis Mother    Hypertension Father    Heart disease Father        quad bypass   Osteoporosis Maternal Grandfather    Breast cancer Paternal Grandmother 38    Breast cancer Paternal Aunt 77    Social History   Socioeconomic History   Marital status: Married    Spouse name: Tawanna Cooler   Number of children: 2   Years of education: Not on file   Highest education level: Bachelor's degree (e.g., BA, AB, BS)  Occupational History   Occupation: Technical brewer: [employer]  Tobacco Use   Smoking status: Never   Smokeless tobacco: Never  Vaping Use   Vaping status: Never Used  Substance and Sexual Activity   Alcohol use: No   Drug use: No   Sexual activity: Not Currently    Partners: Male    Birth control/protection: Surgical    Comment: 1st intercourse 58 yo-Fewer than 5 partners, hysterectomy  Other Topics Concern   Not on file  Social History Narrative   Married, lives with husband and 2 children in a one story home. Drinks one glass of sweet tea a day. Exercises 3-4 x a week.   Social Drivers of Corporate investment banker Strain: Low Risk  (01/15/2024)   Overall Financial Resource Strain (CARDIA)    Difficulty of Paying Living Expenses: Not very hard  Food Insecurity: No Food Insecurity (01/15/2024)   Hunger Vital Sign    Worried About Running  Out of Food in the Last Year: Never true    Ran Out of Food in the Last Year: Never true  Transportation Needs: No Transportation Needs (01/15/2024)   PRAPARE - Administrator, Civil Service (Medical): No    Lack of Transportation (Non-Medical): No  Physical Activity: Sufficiently Active (01/15/2024)   Exercise Vital Sign    Days of Exercise per Week: 7 days    Minutes of Exercise per Session: 30 min  Stress: No Stress Concern Present (01/15/2024)   [employer] of Occupational Health - Occupational Stress Questionnaire    Feeling of Stress : Only a little  Social Connections: Socially Integrated (01/15/2024)   Social Connection and Isolation Panel [NHANES]    Frequency of Communication with Friends and Family: More than three times a week    Frequency of  Social Gatherings with Friends and Family: Twice a week    Attends Religious Services: More than 4 times per year    Active Member of [employer] or Organizations: Yes    Attends Engineer, structural: More than 4 times per year    Marital Status: Married    There were no vitals filed for this visit.  There is no height or weight on file to calculate BMI.  Physical Exam Vitals and nursing note reviewed.  Constitutional:      General: She is not in acute distress.    Appearance: She is well-developed.  HENT:     Head: Normocephalic and atraumatic.     Right Ear: Hearing, tympanic membrane, ear canal and external ear normal.     Left Ear: Hearing, tympanic membrane, ear canal and external ear normal.     Mouth/Throat:     Mouth: Mucous membranes are moist.     Pharynx: Oropharynx is clear. Uvula midline.  Eyes:     Extraocular Movements: Extraocular movements intact.     Conjunctiva/sclera: Conjunctivae normal.     Pupils: Pupils are equal, round, and reactive to light.  Neck:     Thyroid: Thyromegaly present.     Trachea: No tracheal deviation.     Comments: ? Left thyroid enlarged.  Cardiovascular:     Rate and Rhythm: Normal rate and regular rhythm.     Pulses:          Dorsalis pedis pulses are 2+ on the right side and 2+ on the left side.     Heart sounds: No murmur heard. Pulmonary:     Effort: Pulmonary effort is normal. No respiratory distress.     Breath sounds: Normal breath sounds.  Abdominal:     Palpations: Abdomen is soft. There is no hepatomegaly or mass.     Tenderness: There is no abdominal tenderness.  Genitourinary:    Comments: Deferred to gyn. Musculoskeletal:     Right lower leg: No edema.     Left lower leg: No edema.     Comments: No major deformity or signs of synovitis appreciated.  Lymphadenopathy:     Cervical: No cervical adenopathy.     Upper Body:     Right upper body: No supraclavicular adenopathy.     Left upper body: No  supraclavicular adenopathy.  Skin:    General: Skin is warm.     Findings: No erythema or rash.  Neurological:     General: No focal deficit present.     Mental Status: She is alert and oriented to person, place, and time.     Cranial Nerves: No cranial  nerve deficit.     Sensory: No sensory deficit.     Motor: No weakness.     Coordination: Coordination normal.     Gait: Gait normal.     Deep Tendon Reflexes:     Reflex Scores:      Bicep reflexes are 2+ on the right side and 2+ on the left side.      Patellar reflexes are 2+ on the right side and 2+ on the left side. Psychiatric:     Comments: Well groomed, good eye contact.    ASSESSMENT AND PLAN:  Ms. Rattan was seen today to establish care.   There are no diagnoses linked to this encounter.  No follow-ups on file.  I, Rolla Etienne Wierda, acting as a scribe for Avianna Moynahan [place], MD., have documented all relevant documentation on the behalf of Takeila Thayne [place], MD, as directed by  Saretta Dahlem [place], MD while in the presence of Jaimon Bugaj [place], MD.   I, Renay Crammer [place], MD, have reviewed all documentation for this visit. The documentation on 01/16/24 for the exam, diagnosis, procedures, and orders are all accurate and complete.  Kennice Finnie G. [place], MD  [facility]. [place] office.

## 2024-01-16 NOTE — Assessment & Plan Note (Signed)
 We discussed the importance of regular physical activity and healthy diet for prevention of chronic illness and/or complications. Preventive guidelines reviewed. Vaccination: Not sure about vaccination, will check at her gynecologist's office for records. Colonoscopy in 04/2022 at [facility] physicians, s/p polypectomy, 5 years follow up recommended per pt report. Ca++ and vit D supplementation to continue. Female care to continue with her gynecologist. Next CPE in a year.

## 2024-01-17 ENCOUNTER — Other Ambulatory Visit (INDEPENDENT_AMBULATORY_CARE_PROVIDER_SITE_OTHER)

## 2024-01-17 ENCOUNTER — Ambulatory Visit
Admission: RE | Admit: 2024-01-17 | Discharge: 2024-01-17 | Discharge status: Home / Self Care | Source: Ambulatory Visit | Attending: Family Medicine | Admitting: Family Medicine

## 2024-01-17 DIAGNOSIS — E049 Nontoxic goiter, unspecified: Secondary | ICD-10-CM

## 2024-01-17 DIAGNOSIS — Z1329 Encounter for screening for other suspected endocrine disorder: Secondary | ICD-10-CM | POA: Diagnosis not present

## 2024-01-17 DIAGNOSIS — Z13228 Encounter for screening for other metabolic disorders: Secondary | ICD-10-CM | POA: Diagnosis not present

## 2024-01-17 DIAGNOSIS — Z13220 Encounter for screening for lipoid disorders: Secondary | ICD-10-CM

## 2024-01-17 DIAGNOSIS — Z1159 Encounter for screening for other viral diseases: Secondary | ICD-10-CM

## 2024-01-17 DIAGNOSIS — Z130 Encounter for screening for diseases of the blood and blood-forming organs and certain disorders involving the immune mechanism: Secondary | ICD-10-CM

## 2024-01-17 LAB — BASIC METABOLIC PANEL WITH GFR
BUN: 17 mg/dL (ref 6–23)
CO2: 30 meq/L (ref 19–32)
Calcium: 9.5 mg/dL (ref 8.4–10.5)
Chloride: 99 meq/L (ref 96–112)
Creatinine, Ser: 0.8 mg/dL (ref 0.40–1.20)
GFR: 82.67 mL/min (ref >60.00)
Glucose, Bld: 72 mg/dL (ref 70–99)
Potassium: 3.5 meq/L (ref 3.5–5.1)
Sodium: 138 meq/L (ref 135–145)

## 2024-01-17 LAB — LIPID PANEL
Cholesterol: 160 mg/dL (ref 0–200)
HDL: 62.3 mg/dL (ref >39.00)
LDL Cholesterol: 82 mg/dL (ref 0–99)
NonHDL: 97.3
Total CHOL/HDL Ratio: 3
Triglycerides: 75 mg/dL (ref 0.0–149.0)
VLDL: 15 mg/dL (ref 0.0–40.0)

## 2024-01-17 LAB — TSH: TSH: 2.21 u[IU]/mL (ref 0.35–5.50)

## 2024-01-18 LAB — HEPATITIS C ANTIBODY: Hepatitis C Ab: NONREACTIVE

## 2024-01-19 ENCOUNTER — Encounter: Payer: Self-pay | Admitting: Family Medicine

## 2024-03-29 ENCOUNTER — Telehealth: Payer: Self-pay | Admitting: Obstetrics and Gynecology

## 2024-03-29 DIAGNOSIS — M8580 Other specified disorders of bone density and structure, unspecified site: Secondary | ICD-10-CM

## 2024-03-29 NOTE — Telephone Encounter (Signed)
 Reminder on repeat bone scan Dr. Tia Flowers

## 2024-04-30 ENCOUNTER — Other Ambulatory Visit: Payer: Self-pay | Admitting: Obstetrics and Gynecology

## 2024-04-30 DIAGNOSIS — Z1231 Encounter for screening mammogram for malignant neoplasm of breast: Secondary | ICD-10-CM

## 2024-05-17 ENCOUNTER — Ambulatory Visit

## 2024-05-25 ENCOUNTER — Ambulatory Visit

## 2024-06-19 ENCOUNTER — Ambulatory Visit

## 2024-06-20 ENCOUNTER — Ambulatory Visit
Admission: RE | Admit: 2024-06-20 | Discharge: 2024-06-20 | Disposition: A | Discharge status: Home / Self Care | Source: Ambulatory Visit | Attending: Obstetrics and Gynecology | Admitting: Obstetrics and Gynecology

## 2024-06-20 DIAGNOSIS — Z1231 Encounter for screening mammogram for malignant neoplasm of breast: Secondary | ICD-10-CM

## 2024-06-26 ENCOUNTER — Ambulatory Visit (HOSPITAL_BASED_OUTPATIENT_CLINIC_OR_DEPARTMENT_OTHER): Payer: Self-pay | Admitting: Obstetrics and Gynecology

## 2024-07-09 ENCOUNTER — Encounter (HOSPITAL_BASED_OUTPATIENT_CLINIC_OR_DEPARTMENT_OTHER): Payer: Self-pay | Admitting: *Deleted

## 2024-07-09 ENCOUNTER — Emergency Department (HOSPITAL_BASED_OUTPATIENT_CLINIC_OR_DEPARTMENT_OTHER)
Admission: EM | Admit: 2024-07-09 | Discharge: 2024-07-09 | Disposition: A | Discharge status: Home / Self Care | Attending: Emergency Medicine | Admitting: Emergency Medicine

## 2024-07-09 ENCOUNTER — Emergency Department (HOSPITAL_BASED_OUTPATIENT_CLINIC_OR_DEPARTMENT_OTHER)

## 2024-07-09 ENCOUNTER — Ambulatory Visit: Admission: EM | Admit: 2024-07-09 | Discharge: 2024-07-09 | Discharge status: Against Medical Advice

## 2024-07-09 DIAGNOSIS — R109 Unspecified abdominal pain: Secondary | ICD-10-CM

## 2024-07-09 DIAGNOSIS — Z91010 Allergy to peanuts: Secondary | ICD-10-CM | POA: Insufficient documentation

## 2024-07-09 DIAGNOSIS — M549 Dorsalgia, unspecified: Secondary | ICD-10-CM | POA: Diagnosis present

## 2024-07-09 DIAGNOSIS — M545 Low back pain, unspecified: Secondary | ICD-10-CM | POA: Insufficient documentation

## 2024-07-09 LAB — COMPREHENSIVE METABOLIC PANEL WITH GFR
ALT: 49 U/L — ABNORMAL HIGH (ref 0–44)
AST: 34 U/L (ref 15–41)
Albumin: 5 g/dL (ref 3.5–5.0)
Alkaline Phosphatase: 80 U/L (ref 38–126)
Anion gap: 14 (ref 5–15)
BUN: 12 mg/dL (ref 6–20)
CO2: 25 mmol/L (ref 22–32)
Calcium: 10.2 mg/dL (ref 8.9–10.3)
Chloride: 99 mmol/L (ref 98–111)
Creatinine, Ser: 0.77 mg/dL (ref 0.44–1.00)
GFR, Estimated: >60 mL/min (ref >60)
Glucose, Bld: 117 mg/dL — ABNORMAL HIGH (ref 70–99)
Potassium: 4 mmol/L (ref 3.5–5.1)
Sodium: 137 mmol/L (ref 135–145)
Total Bilirubin: 0.7 mg/dL (ref 0.0–1.2)
Total Protein: 8 g/dL (ref 6.5–8.1)

## 2024-07-09 LAB — URINALYSIS, ROUTINE W REFLEX MICROSCOPIC
Bilirubin Urine: NEGATIVE
Glucose, UA: NEGATIVE mg/dL
Hgb urine dipstick: NEGATIVE
Ketones, ur: 15 mg/dL — AB
Leukocytes,Ua: NEGATIVE
Nitrite: NEGATIVE
Protein, ur: NEGATIVE mg/dL
Specific Gravity, Urine: 1.024 (ref 1.005–1.030)
pH: 5.5 (ref 5.0–8.0)

## 2024-07-09 LAB — CBC
HCT: 44.9 % (ref 36.0–46.0)
Hemoglobin: 15.4 g/dL — ABNORMAL HIGH (ref 12.0–15.0)
MCH: 31.2 pg (ref 26.0–34.0)
MCHC: 34.3 g/dL (ref 30.0–36.0)
MCV: 90.9 fL (ref 80.0–100.0)
Platelets: 201 K/uL (ref 150–400)
RBC: 4.94 MIL/uL (ref 3.87–5.11)
RDW: 11.9 % (ref 11.5–15.5)
WBC: 5.6 K/uL (ref 4.0–10.5)
nRBC: 0 % (ref 0.0–0.2)

## 2024-07-09 MED ORDER — KETOROLAC TROMETHAMINE 15 MG/ML IJ SOLN
15.0000 mg | Freq: Once | INTRAMUSCULAR | Status: AC
Start: 2024-07-09 — End: 2024-07-09
  Administered 2024-07-09: 15 mg via INTRAVENOUS
  Filled 2024-07-09: qty 1

## 2024-07-09 MED ORDER — SODIUM CHLORIDE 0.9 % IV BOLUS
500.0000 mL | Freq: Once | INTRAVENOUS | Status: AC
  Administered 2024-07-09: 500 mL via INTRAVENOUS

## 2024-07-09 MED ORDER — CELECOXIB 200 MG PO CAPS: 200.0000 mg | ORAL_CAPSULE | Freq: Two times a day (BID) | ORAL | 0 refills | Status: AC | PRN

## 2024-07-09 MED ORDER — CYCLOBENZAPRINE HCL 10 MG PO TABS: 10.0000 mg | ORAL_TABLET | Freq: Two times a day (BID) | ORAL | 0 refills | Status: AC | PRN

## 2024-07-09 MED ORDER — ONDANSETRON HCL 4 MG/2ML IJ SOLN
4.0000 mg | Freq: Once | INTRAMUSCULAR | Status: AC
  Administered 2024-07-09: 4 mg via INTRAVENOUS
  Filled 2024-07-09: qty 2

## 2024-07-09 NOTE — ED Notes (Addendum)
 na

## 2024-07-09 NOTE — ED Provider Notes (Signed)
 [facility] Provider Note   CSN: [id number] Arrival date & time: 07/09/24  1553     Patient presents with: Back Pain   Jamie Hale is a 58 y.o. female.    Back Pain   58 year old female presents emergency department complaints of left-sided flank pain.  Began earlier today.  Worsened with movement.  Does report radiation to her left groin region as well as left buttock.  Denies any fevers, chills, nausea, vomiting, urinary symptoms, change in bowel habits.  Denies history of similar symptoms in the past.  Has taken no medication for symptoms.  Past medical history significant for seizure, headache, allergies  Prior to Admission medications   Medication Sig Start Date End Date Taking? Authorizing Provider  BIOTIN PO Take by mouth.    [provider]  Calcium Citrate-Vitamin D (CALCIUM + D PO) Take by mouth.    [provider]  Omega-3 Fatty Acids (FISH OIL PO) Take by mouth.    [provider]    Allergies: Gluten meal and Peanut-containing drug products    Review of Systems  Musculoskeletal:  Positive for back pain.  All other systems reviewed and are negative.   Updated Vital Signs BP (!) 144/91 (BP Location: Right Arm)   Pulse 68   Temp 98.9 F (37.2 C) (Oral)   Resp 20   LMP 04/17/2020   SpO2 95%   Physical Exam Vitals and nursing note reviewed.  Constitutional:      General: She is not in acute distress.    Appearance: She is well-developed.  HENT:     Head: Normocephalic and atraumatic.  Eyes:     Conjunctiva/sclera: Conjunctivae normal.  Cardiovascular:     Rate and Rhythm: Normal rate and regular rhythm.     Heart sounds: No murmur heard. Pulmonary:     Effort: Pulmonary effort is normal. No respiratory distress.     Breath sounds: Normal breath sounds.  Abdominal:     Palpations: Abdomen is soft.     Tenderness: There is no abdominal tenderness.  Musculoskeletal:         General: No swelling.     Cervical back: Neck supple.     Comments: Left-sided CVA versus paraspinal tenderness in lumbar region.  5 out of 5 muscular strength bilateral lower extremities.  No sensory deficits on major distributions of lower extremities.  DTR symmetric at patella.  Pedal and posttibial pulses 2+ bilaterally.  Skin:    General: Skin is warm and dry.     Capillary Refill: Capillary refill takes less than 2 seconds.  Neurological:     Mental Status: She is alert.  Psychiatric:        Mood and Affect: Mood normal.     (all labs ordered are listed, but only abnormal results are displayed) Labs Reviewed  URINALYSIS, ROUTINE W REFLEX MICROSCOPIC - Abnormal; Notable for the following components:      Result Value   Ketones, ur 15 (*)    All other components within normal limits  COMPREHENSIVE METABOLIC PANEL WITH GFR - Abnormal; Notable for the following components:   Glucose, Bld 117 (*)    ALT 49 (*)    All other components within normal limits  CBC - Abnormal; Notable for the following components:   Hemoglobin 15.4 (*)    All other components within normal limits    EKG: None  Radiology: CT L-SPINE NO CHARGE Result Date: 07/09/2024 EXAM: CT OF THE  LUMBAR SPINE WITHOUT CONTRAST 07/09/2024 07:06:56 PM TECHNIQUE: CT of the lumbar spine was performed without the administration of intravenous contrast. Multiplanar reformatted images are provided for review. Automated exposure control, iterative reconstruction, and/or weight based adjustment of the mA/kV was utilized to reduce the radiation dose to as low as reasonably achievable. COMPARISON: None available. CLINICAL HISTORY: Pt to ED with left sided stabbing flank pain. No known injury. FINDINGS: BONES AND ALIGNMENT: Normal vertebral body heights. No acute fracture or suspicious bone lesion. Normal alignment. DEGENERATIVE CHANGES: Mild degenerative changes at L2-3. SOFT TISSUES: No acute abnormality. IMPRESSION: 1. No acute  findings. Electronically signed by: Pinkie Pebbles MD 07/09/2024 07:31 PM EDT RP Workstation: [id number]   CT Renal Stone Study Result Date: 07/09/2024 EXAM: CT ABDOMEN AND PELVIS WITHOUT CONTRAST 07/09/2024 07:06:56 PM TECHNIQUE: CT of the abdomen and pelvis was performed without the administration of intravenous contrast. Multiplanar reformatted images are provided for review. Automated exposure control, iterative reconstruction, and/or weight-based adjustment of the mA/kV was utilized to reduce the radiation dose to as low as reasonably achievable. COMPARISON: None available. CLINICAL HISTORY: Abdominal/flank pain, stone suspected. Pt to ED with left sided stabbing flank pain. No known injury. FINDINGS: LOWER CHEST: No acute abnormality. LIVER: The liver is unremarkable. GALLBLADDER AND BILE DUCTS: Status post cholecystectomy. No biliary ductal dilatation. SPLEEN: No acute abnormality. PANCREAS: No acute abnormality. ADRENAL GLANDS: No acute abnormality. KIDNEYS, URETERS AND BLADDER: No stones in the kidneys or ureters. No hydronephrosis. No perinephric or periureteral stranding. Urinary bladder is unremarkable. GI AND BOWEL: Normal appendix (image 61). Stomach demonstrates no acute abnormality. There is no bowel obstruction. PERITONEUM AND RETROPERITONEUM: No ascites. No free air. Tiny fat-containing periumbilical hernia. VASCULATURE: Aorta is normal in caliber. LYMPH NODES: No lymphadenopathy. REPRODUCTIVE ORGANS: Status post hysterectomy. No acute abnormality. BONES AND SOFT TISSUES: Mild degenerative changes at L2-3. No acute osseous abnormality. No focal soft tissue abnormality. IMPRESSION: 1. No acute findings in the abdomen or pelvis. Electronically signed by: Pinkie Pebbles MD 07/09/2024 07:30 PM EDT RP Workstation: [id number]     Procedures   Medications Ordered in the ED  ketorolac  (TORADOL ) 15 MG/ML injection 15 mg (15 mg Intravenous Given 07/09/24 1813)  ondansetron  (ZOFRAN ) injection 4  mg (4 mg Intravenous Given 07/09/24 1813)  sodium chloride  0.9 % bolus 500 mL (500 mLs Intravenous New Bag/Given 07/09/24 1813)                                    Medical Decision Making Amount and/or Complexity of Data Reviewed Labs: ordered. Radiology: ordered.  Risk Prescription drug management.   This patient presents to the ED for concern of low back pain, this involves an extensive number of treatment options, and is a complaint that carries with it a high risk of complications and morbidity.  The differential diagnosis includes pyelonephritis, nephrolithiasis, strain/sprain, fracture, dislocation, aortic dissection, cauda equina, spinal epidural abscess, other   Co morbidities that complicate the patient evaluation  See HPI   Additional history obtained:  Additional history obtained from EMR External records from outside source obtained and reviewed including hospital records   Lab Tests:  I Ordered, and personally interpreted labs.  The pertinent results include: No leukocytosis.  Polycythemia hemoglobin of 15.4.  Platelets within range.  No Electra abnormalities.  Slight elevation of ALT of 49.  No renal dysfunction.  UA 15 ketones otherwise unremarkable.   Imaging Studies ordered:  I  ordered imaging studies including CT renal stone study/L-spine I independently visualized and interpreted imaging which showed no acute abnormality. I agree with the radiologist interpretation   Cardiac Monitoring: / EKG:  The patient was maintained on a cardiac monitor.  I personally viewed and interpreted the cardiac monitored which showed an underlying rhythm of: Sinus rhythm   Consultations Obtained:  N/a   Problem List / ED Course / Critical interventions / Medication management  Left flank pain I ordered medication including Toradol , Zofran , normal saline   Reevaluation of the patient after these medicines showed that the patient improved I have reviewed the patients  home medicines and have made adjustments as needed   Social Determinants of Health:  Denies tobacco, licit drug use.   Test / Admission - Considered:  Left flank pain Vitals signs significant for hypertension blood pressure 144/91. Otherwise within normal range and stable throughout visit. Laboratory/imaging studies significant for: See above  58 year old female presents emergency department complaints of left-sided flank pain.  Began earlier today.  Worsened with movement.  Does report radiation to her left groin region as well as left buttock.  Denies any fevers, chills, nausea, vomiting, urinary symptoms, change in bowel habits.  Denies history of similar symptoms in the past.  Has taken no medication for symptoms. On exam, left-sided CVA versus paraspinal tenderness noted in lumbar region.  No red flag signs for back pain on HPI/PE; low suspicion for cauda equina, spinal epidural abscess, other spinal cord compression/impingement.  Given abrupt onset nature, concern for possible kidney stone.  CT renal stone study was obtained which was reassuring; no obvious stone present.  UA noninfectious.  Patient without abdominal pain rating to back, pulse deficits, neurodeficits,; low suspicion for aortic dissection.  Suspect MSK etiology.  Will recommend symptomatic therapy as in AVS and follow-up with primary care in the outpatient setting for reassessment.  Treatment plan discussed with patient she is understanding was agreeable.  Patient well-appearing, afebrile in no acute distress upon discharge. Worrisome signs and symptoms were discussed with the patient, and the patient acknowledged understanding to return to the ED if noticed. Patient was stable upon discharge.       Final diagnoses:  None    ED Discharge Orders     None          Silver Wonda LABOR, [place] 07/09/24 KENITH Geraldene Hamilton, MD 07/10/24 [id number]

## 2024-07-09 NOTE — Discharge Instructions (Addendum)
 As discussed, your workup today was very reassuring.  Your labs appeared normal.  CT imaging did not show any obvious kidney stone or other abnormality in your abdomen or pelvis or lumbar spine as cause of your symptoms.  Suspect muscular etiology of your symptoms.  Will send you home with anti-inflammatory as well as muscle laxer to use as needed.  Muscle laxer can cause drowsiness so please not drive or perform any high risk activity tubulous its effects on you.  Recommend follow with your primary care for reassessment.

## 2024-07-09 NOTE — ED Triage Notes (Addendum)
 Pt to ED with left sided stabbing flank pain. No known injury.

## 2024-07-11 ENCOUNTER — Ambulatory Visit: Admitting: Family Medicine

## 2024-07-11 ENCOUNTER — Encounter: Payer: Self-pay | Admitting: Family Medicine

## 2024-07-11 VITALS — BP 114/80 | HR 75 | Temp 98.4°F | Resp 16 | Ht 68.0 in | Wt 157.0 lb

## 2024-07-11 DIAGNOSIS — M545 Low back pain, unspecified: Secondary | ICD-10-CM | POA: Diagnosis not present

## 2024-07-11 DIAGNOSIS — R202 Paresthesia of skin: Secondary | ICD-10-CM

## 2024-07-11 MED ORDER — VALACYCLOVIR HCL 1 G PO TABS: 1000.0000 mg | ORAL_TABLET | Freq: Three times a day (TID) | ORAL | 0 refills | Status: AC

## 2024-07-11 NOTE — Progress Notes (Addendum)
 Chief Complaint  Patient presents with   Hospitalization Follow-up   clinical note transcription with the patient, who gave verbal consent to proceed.  History of Present Illness Jamie Hale is a 58 year old female who is here today with her husband to follow on recent ED visit. Evaluated in the ED for left flank pain, 07/09/24.  She has been experiencing constant left flank pain since Monday evening, described as a stabbing and piercing sensation with intermittent throb and occasional intensification. The pain was rated as 6 out of 10, escalating to 8 during stabbing episodes. It persisted until today at 11 AM when it subsided. The pain initially started in the left low back and radiated around to the waist to left side of abdomen. It is not radiated to LE. She also experienced numbness in the same area, which increased after Monday evening and remains present. No associated leg pain, numbness, or tingling. She has not noted rash.  She experienced some nausea with the sharp pains but did not vomit.  There was no blood in the urine, and she reported two bouts of diarrhea on Monday morning, which she initially thought might be related to an intestinal issue.   No fever or chills, although she felt achy with the pain. She has been using ice for pain relief, applying it for twenty minutes every couple of hours since last night.  She underwent imaging and blood work at the emergency room. She was prescribed Flexeril  10 mg and Celebrex  200 mg.  CT of abdominal pelvis without contrast was negative for acute findings. Lumbar spine CT: Mild degenerative changes at L2-3.  Lab Results  Component Value Date   NA 137 07/09/2024   CL 99 07/09/2024   K 4.0 07/09/2024   CO2 25 07/09/2024   BUN 12 07/09/2024   CREATININE 0.77 07/09/2024   GFRNONAA >60 07/09/2024   CALCIUM 10.2 07/09/2024   ALBUMIN 5.0 07/09/2024   GLUCOSE 117 (H) 07/09/2024   Lab Results  Component Value Date   ALT  49 (H) 07/09/2024   AST 34 07/09/2024   ALKPHOS 80 07/09/2024   BILITOT 0.7 07/09/2024   Lab Results  Component Value Date   WBC 5.6 07/09/2024   HGB 15.4 (H) 07/09/2024   HCT 44.9 07/09/2024   MCV 90.9 07/09/2024   PLT 201 07/09/2024   Review of Systems  Constitutional:  Positive for activity change. Negative for appetite change and unexpected weight change.  HENT:  Negative for sore throat.   Respiratory:  Negative for cough and shortness of breath.   Cardiovascular:  Negative for chest pain, palpitations and leg swelling.  Gastrointestinal:  Negative for abdominal pain and vomiting.  Genitourinary:  Negative for decreased urine volume, dysuria and hematuria.  Skin:  Negative for color change.  Neurological:  Negative for syncope, weakness and headaches.  Psychiatric/Behavioral:  Negative for confusion.   See other pertinent positives and negatives in HPI.  Current Outpatient Medications on File Prior to Visit  Medication Sig Dispense Refill   BIOTIN PO Take by mouth.     Calcium Citrate-Vitamin D (CALCIUM + D PO) Take by mouth.     celecoxib  (CELEBREX ) 200 MG capsule Take 1 capsule (200 mg total) by mouth 2 (two) times daily as needed. 30 capsule 0   cyclobenzaprine  (FLEXERIL ) 10 MG tablet Take 1 tablet (10 mg total) by mouth 2 (two) times daily as needed for muscle spasms. 20 tablet 0   Omega-3 Fatty Acids (  FISH OIL PO) Take by mouth.     No current facility-administered medications on file prior to visit.   Past Medical History:  Diagnosis Date   Allergy 2020   Gluten and peanuts   Headache    hx of migraines   Hypotension    Patient states that blood pressure sometimes runs in the 90's over 50's.   Osteopenia    Seizure (HCC) 1994   eclamptic, no seizures since   Vaginal Pap smear, abnormal    osteopenia   Wears glasses    Allergies  Allergen Reactions   Gluten Meal Itching   Peanut-Containing Drug Products Itching    Social History   Socioeconomic  History   Marital status: Married    Spouse name: Krystal   Number of children: 2   Years of education: Not on file   Highest education level: Bachelor's degree (e.g., BA, AB, BS)  Occupational History   Occupation: Technical brewer: [employer]  Tobacco Use   Smoking status: Never   Smokeless tobacco: Never  Vaping Use   Vaping status: Never Used  Substance and Sexual Activity   Alcohol use: No   Drug use: No   Sexual activity: Yes    Partners: Male    Birth control/protection: Surgical    Comment: 1st intercourse 58 yo-Fewer than 5 partners, hysterectomy  Other Topics Concern   Not on file  Social History Narrative   Married, lives with husband and 2 children in a one story home. Drinks one glass of sweet tea a day. Exercises 3-4 x a week.   Social Drivers of Corporate investment banker Strain: Low Risk  (01/15/2024)   Overall Financial Resource Strain (CARDIA)    Difficulty of Paying Living Expenses: Not very hard  Food Insecurity: No Food Insecurity (01/15/2024)   Hunger Vital Sign    Worried About Running Out of Food in the Last Year: Never true    Ran Out of Food in the Last Year: Never true  Transportation Needs: No Transportation Needs (01/15/2024)   PRAPARE - Administrator, Civil Service (Medical): No    Lack of Transportation (Non-Medical): No  Physical Activity: Sufficiently Active (01/15/2024)   Exercise Vital Sign    Days of Exercise per Week: 7 days    Minutes of Exercise per Session: 30 min  Stress: No Stress Concern Present (01/15/2024)   [employer] of Occupational Health - Occupational Stress Questionnaire    Feeling of Stress : Only a little  Social Connections: Socially Integrated (01/15/2024)   Social Connection and Isolation Panel    Frequency of Communication with Friends and Family: More than three times a week    Frequency of Social Gatherings with Friends and Family: Twice a week    Attends Religious Services: More  than 4 times per year    Active Member of [employer] or Organizations: Yes    Attends Engineer, structural: More than 4 times per year    Marital Status: Married   Today's Vitals   07/11/24 1305  BP: 114/80  Pulse: 75  Resp: 16  Temp: 98.4 F (36.9 C)  TempSrc: Oral  SpO2: 96%  Weight: 157 lb (71.2 kg)  Height: 5' 8 (1.727 m)   Body mass index is 23.87 kg/m.  Physical Exam Vitals and nursing note reviewed.  Constitutional:      General: She is not in acute distress.    Appearance: She is well-developed. She  is not ill-appearing.  HENT:     Head: Normocephalic and atraumatic.  Eyes:     Conjunctiva/sclera: Conjunctivae normal.  Cardiovascular:     Rate and Rhythm: Normal rate and regular rhythm.     Heart sounds: No murmur heard. Pulmonary:     Effort: Pulmonary effort is normal. No respiratory distress.     Breath sounds: Normal breath sounds.  Abdominal:     Palpations: Abdomen is soft. There is no hepatomegaly or mass.     Tenderness: There is no abdominal tenderness.  Musculoskeletal:     Lumbar back: Tenderness present. No spasms. Negative right straight leg raise test and negative left straight leg raise test.       Back:     Comments: No local edema or erythema appreciated, no suspicious lesions.  Skin:    General: Skin is warm.     Findings: No erythema or rash.  Neurological:     General: No focal deficit present.     Mental Status: She is alert and oriented to person, place, and time.     Comments: Antalgic gait, not assisted.  Psychiatric:        Mood and Affect: Mood and affect normal.   ASSESSMENT AND PLAN:  Ms. Jamie Hale was seen today for ED follow-up.  Diagnoses and all orders for this visit:  Acute left-sided low back pain, unspecified whether sciatica present We discussed possible etiologies, it seems to be musculoskeletal. It seems improved. States that Celebrex  200 mg was not very effective when she was having severe pain, so  discontinued. She can try topical IcyHot. Continue Flexeril  daily at bedtime if needed. Monitor for new symptoms.  Paresthesia Numbness extending from left-sided low back to LUQ, no skin changes appreciated. ? Radicular, viral. Blood work during ED evaluation with no significant abnormalities, mild elevation of ALT, which we could follow during her routine CPE, before if needed. Instructed to monitor for rash ans if any she is to start Valtrex  for possible zoster. If there is no rash ans persists for 4-6 weeks, lumbar MRI can be considered.  Other orders -     valACYclovir  (VALTREX ) 1000 MG tablet; Take 1 tablet (1,000 mg total) by mouth 3 (three) times daily for 7 days.   Return if symptoms worsen or fail to improve, for keep next appointment.  Nicholis Stepanek G. [place], MD  [facility]. [place] office.

## 2024-07-11 NOTE — Patient Instructions (Addendum)
 A few things to remember from today's visit:  Acute left-sided low back pain, unspecified whether sciatica present  Paresthesia  Monitor for new symptoms including rash affecting area, if any start antiviral med. If in 4-5 weeks still numbness with no rash, we can consider MRI.  Do not use My Chart to request refills or for acute issues that need immediate attention. If you send a my chart message, it may take a few days to be addressed, specially if I am not in the office.  Please be sure medication list is accurate. If a new problem present, please set up appointment sooner than planned today.

## 2024-07-12 ENCOUNTER — Encounter: Payer: Self-pay | Admitting: Family Medicine

## 2024-07-13 ENCOUNTER — Ambulatory Visit: Payer: Self-pay

## 2024-07-13 ENCOUNTER — Inpatient Hospital Stay: Admitting: Family Medicine

## 2024-07-13 DIAGNOSIS — R7401 Elevation of levels of liver transaminase levels: Secondary | ICD-10-CM

## 2024-07-13 NOTE — Telephone Encounter (Signed)
 FYI Only or Action Required?: FYI only for provider.  Patient was last seen in primary care on 07/11/2024 by [place], Betty G, MD.  Called Nurse Triage reporting Rash.  Symptoms began several days ago.  Interventions attempted: Prescription medications: Valtrex .  Symptoms are: gradually improving.  Triage Disposition: Home Care  Patient/caregiver understands and will follow disposition?: Yes  **See note below**       Message from Mia F sent at 07/13/2024 10:58 AM EDT   Pt says she was seen in the office Tuesday for a hospital follow up. At the time she was seen she only had pain in the lower left of her back and hip area. She was treated for Shingles at that time. By Wednesday she ended up having a full painful rash. She has been managing it with medications and vitamins. She says the symptoms seems to be subsiding but she does have a small rash left. It is not painful. Looking for advise of what to do if she ned to do anything and would like to inform her dr of what is going on   Reason for Disposition  [1] Shingles rash already diagnosed and [2] taking antiviral medication  Answer Assessment - Initial Assessment Questions 1. APPEARANCE of RASH: What does the rash look like?      Red, small bumps   2. LOCATION: Where is the rash located?      Left waistline area  3. ONSET: When did the rash start?      A few days ago   4. ITCHING: Does the rash itch? If Yes, ask: How bad is the itch?  (Scale 1-10; or mild, moderate, severe)     No   5. PAIN: Does the rash hurt? If Yes, ask: How bad is the pain?  (Scale 0-10; or none, mild, moderate, severe)     Mild   6. OTHER SYMPTOMS: Do you have any other symptoms? (e.g., fever) No   Seen in office a few days ago, she was treated for shingles at that time,  on Valtrex  TID currently. Home care advice given for the shingles rash; patient understands. She will call back if symptoms worsen.  Protocols used: Shingles  (Zoster)-A-AH

## 2024-07-16 NOTE — Telephone Encounter (Signed)
 Please verify. I did recommend starting Valtrex  just if she sees any rash on area of pain, not before, so complete treatment as instructed. For pain management she can apply OTC IcyHot with lidocaine . Thanks, BJ

## 2024-07-17 NOTE — Telephone Encounter (Signed)
 Left voicemail for patient to return my call.

## 2024-07-18 NOTE — Telephone Encounter (Signed)
 Spoke with patient and gave provider's recommendations. Patient states she is getting better is completing course of medication. Patient has question regarding lab work that she had completed while in the ED. Patient states she had so abnormal lab values and she wants to know if she should follow up on the blood work and if so what timeframe should she come to complete it.

## 2024-07-18 NOTE — Addendum Note (Signed)
 Addended by: DIONISIO CAMELIA PARAS on: 07/18/2024 03:18 PM   Modules accepted: Orders

## 2024-07-18 NOTE — Telephone Encounter (Signed)
 I believe we discussed results of labs done in the ED, no significant abnormalities.  ALT (one of the liver enzymes) was mildly elevated.  I was planning on rechecking it during her next CPE, which is usually around March.  If she is still concerned, we can repeat in 2 to 3 months (hepatic panel). If patient has more questions or concerns, we can arrange a follow-up appointment. Thanks, BJ

## 2024-07-18 NOTE — Telephone Encounter (Signed)
 Spoke with patient regarding Dr. Gib recommendations and response. Patient would like to come in for a lab appointment to recheck hepatic enzymes. Appointment made for January 2026. Labs placed for future date per Dr. Gib order.

## 2024-10-23 ENCOUNTER — Ambulatory Visit: Payer: Self-pay | Admitting: Family Medicine

## 2024-10-23 ENCOUNTER — Other Ambulatory Visit (INDEPENDENT_AMBULATORY_CARE_PROVIDER_SITE_OTHER)

## 2024-10-23 DIAGNOSIS — R7401 Elevation of levels of liver transaminase levels: Secondary | ICD-10-CM | POA: Diagnosis not present

## 2024-10-23 LAB — HEPATIC FUNCTION PANEL
ALT: 25 U/L (ref 3–35)
AST: 22 U/L (ref 5–37)
Albumin: 4.6 g/dL (ref 3.5–5.2)
Alkaline Phosphatase: 59 U/L (ref 39–117)
Bilirubin, Direct: 0.1 mg/dL (ref 0.1–0.3)
Total Bilirubin: 0.6 mg/dL (ref 0.2–1.2)
Total Protein: 7.3 g/dL (ref 6.0–8.3)

## 2024-11-29 ENCOUNTER — Other Ambulatory Visit (HOSPITAL_BASED_OUTPATIENT_CLINIC_OR_DEPARTMENT_OTHER)
